# Patient Record
Sex: Male | Born: 1940 | Race: White | Hispanic: No | Smoking: Never smoker
Health system: Southern US, Community
[De-identification: ages and names within clinical notes are randomized; demographics above are authoritative.]

## PROBLEM LIST (undated history)

## (undated) DIAGNOSIS — M1990 Unspecified osteoarthritis, unspecified site: Secondary | ICD-10-CM

## (undated) DIAGNOSIS — F419 Anxiety disorder, unspecified: Secondary | ICD-10-CM

## (undated) DIAGNOSIS — M726 Necrotizing fasciitis: Secondary | ICD-10-CM

## (undated) DIAGNOSIS — I10 Essential (primary) hypertension: Secondary | ICD-10-CM

## (undated) DIAGNOSIS — F0280 Dementia in other diseases classified elsewhere without behavioral disturbance: Secondary | ICD-10-CM

## (undated) DIAGNOSIS — G309 Alzheimer's disease, unspecified: Secondary | ICD-10-CM

## (undated) DIAGNOSIS — S329XXA Fracture of unspecified parts of lumbosacral spine and pelvis, initial encounter for closed fracture: Secondary | ICD-10-CM

## (undated) HISTORY — DX: Essential (primary) hypertension: I10

## (undated) HISTORY — DX: Alzheimer's disease, unspecified: G30.9

## (undated) HISTORY — PX: AMPUTATION ARM: SHX6593

## (undated) HISTORY — DX: Dementia in other diseases classified elsewhere, unspecified severity, without behavioral disturbance, psychotic disturbance, mood disturbance, and anxiety: F02.80

## (undated) HISTORY — DX: Fracture of unspecified parts of lumbosacral spine and pelvis, initial encounter for closed fracture: S32.9XXA

## (undated) HISTORY — DX: Anxiety disorder, unspecified: F41.9

## (undated) HISTORY — DX: Unspecified osteoarthritis, unspecified site: M19.90

## (undated) HISTORY — DX: Necrotizing fasciitis: M72.6

---

## 2017-08-06 ENCOUNTER — Encounter: Payer: Self-pay | Admitting: *Deleted

## 2017-08-07 ENCOUNTER — Ambulatory Visit (INDEPENDENT_AMBULATORY_CARE_PROVIDER_SITE_OTHER): Payer: Medicare Other | Admitting: Diagnostic Neuroimaging

## 2017-08-07 ENCOUNTER — Encounter (INDEPENDENT_AMBULATORY_CARE_PROVIDER_SITE_OTHER): Payer: Self-pay

## 2017-08-07 VITALS — BP 154/96 | HR 70 | Ht 68.0 in | Wt 128.6 lb

## 2017-08-07 DIAGNOSIS — R413 Other amnesia: Secondary | ICD-10-CM

## 2017-08-07 DIAGNOSIS — F0390 Unspecified dementia without behavioral disturbance: Secondary | ICD-10-CM

## 2017-08-07 DIAGNOSIS — F03A0 Unspecified dementia, mild, without behavioral disturbance, psychotic disturbance, mood disturbance, and anxiety: Secondary | ICD-10-CM

## 2017-08-07 NOTE — Progress Notes (Signed)
[facility]  PATIENT: Warren Brooks DOB: 04-02-1941  REFERRING CLINICIAN: D Bouska HISTORY FROM: patient and son-in-law and chart review REASON FOR VISIT: new consult    HISTORICAL  CHIEF COMPLAINT:  Chief Complaint  Patient presents with  . New Patient (Initial Visit)    MMSE 16/30 - 14 animals. Patient is a right arm amputee and is also not from [place], so we had some difficulty with some of the questions.    HISTORY OF PRESENT ILLNESS:   76 year old male here for evaluation of memory loss and dementia.  Patient reports some mild memory loss issues over the past 3-5 years.  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  Patient indicates that this diagnosis was first mentioned on an MRI report from 2013.  He does not remember why the MRI scan was ordered at that time.  We do not have a copy of this report.  We do have some office notes from his family medicine clinic starting in 2014.  On those office visit notes patient has diagnosis of attention deficit disorder and Alzheimer's dementia listed.  No details regarding how those diagnoses were made is available.  Patient is a retired Pensions consultant.  He was living in [place] with his wife (second marriage).  Tragically this wife committed suicide in June 2018.  Apparently due to the circumstances and family discussions, patient and family decided that he would be better served to move to [place] to spend time with his daughter and son-in-law.  She was under the impression that this would be a temporary arrangement.  After moving to [facility] patient's daughter, son-in-law, and other family members realize that he would be better served to permanently moved to [facility], especially in light of his diagnosis of dementia, frequent falls, and history of alcohol abuse.  However there has been significant family stress and  tension.  Patient insists on moving back to [place].  He feels like he is capable of living alone even though his family members disagree.  Patient is accompanied by his son-in-law.  Patient's daughter was not able to come to this visit.   REVIEW OF SYSTEMS: Full 14 system review of systems performed and negative with exception of: Only as per HPI.  ALLERGIES: Allergies  Allergen Reactions  . Penicillins Hives    HOME MEDICATIONS: No outpatient prescriptions prior to visit.   No facility-administered medications prior to visit.     PAST MEDICAL HISTORY: Past Medical History:  Diagnosis Date  . Alzheimer disease   . Hypertension     PAST SURGICAL HISTORY: No past surgical history on file.  FAMILY HISTORY: No family history on file.  SOCIAL HISTORY:  Social History   Social History  . Marital status: Widowed    Spouse name: N/A  . Number of children: N/A  . Years of education: N/A   Occupational History  .      lawyer   Social History Main Topics  . Smoking status: Never Smoker  . Smokeless tobacco: Never Used  . Alcohol use Not on file  . Drug use: Unknown  . Sexual activity: Not on file   Other Topics Concern  . Not on file   Social History Narrative  . No narrative on file     PHYSICAL EXAM  GENERAL EXAM/CONSTITUTIONAL: Vitals:  Vitals:   08/07/17 1026  BP: (!) 154/96  Pulse: 70  Weight:  128 lb 9.6 oz (58.3 kg)  Height: 5\' 8"  (1.727 m)     Body mass index is 19.55 kg/m.  No exam data present  Patient is in no distress; well developed, nourished and groomed; neck is supple  CARDIOVASCULAR:  Examination of carotid arteries is normal; no carotid bruits  Regular rate and rhythm, no murmurs  Examination of peripheral vascular system by observation and palpation is normal  EYES:  Ophthalmoscopic exam of optic discs and posterior segments is normal; no papilledema or hemorrhages   MUSCULOSKELETAL: Gait, strength, tone, movements  noted in Neurologic exam below  NEUROLOGIC: MENTAL STATUS:  MMSE - Mini Mental State Exam 08/07/2017  Orientation to time 3  Orientation to Place 2  Registration 3  Attention/ Calculation 2  Recall 2  Language- name 2 objects 2  Language- repeat 0  Language- follow 3 step command 3  Language- read & follow direction 1  Write a sentence 0  Copy design 1  Total score 19    awake, alert, oriented to person  DECR MEMORY and remote memory intact  normal attention and concentration  language fluent, comprehension intact, naming intact,   fund of knowledge appropriate  ARGUMENTATIVE TOWARDS SON-IN-LAW; IMPATIENT; DECREASED INSIGHT  CRANIAL NERVE:   2nd - no papilledema on fundoscopic exam  2nd, 3rd, 4th, 6th - pupils equal and reactive to light, visual fields full to confrontation, extraocular muscles intact, no nystagmus  5th - facial sensation symmetric  7th - facial strength symmetric  8th - hearing intact  9th - palate elevates symmetrically, uvula midline  11th - shoulder shrug symmetric  12th - tongue protrusion midline  MOTOR:   normal bulk and tone, full strength in the BUE, BLE  RIGHT ARM AMPUTATION  SENSORY:   normal and symmetric to light touch, temperature  DECR VIBRATION AT TOES  COORDINATION:   finger-nose-finger, fine finger movements normal  REFLEXES:   deep tendon reflexes TRACE and symmetric  GAIT/STATION:   narrow based gait; SLOW AND UNSTEADY GAIT    DIAGNOSTIC DATA (LABS, IMAGING, TESTING) - I reviewed patient records, labs, notes, testing and imaging myself where available.  No results found for: WBC, HGB, HCT, MCV, PLT No results found for: NA, K, CL, CO2, GLUCOSE, BUN, CREATININE, CALCIUM, PROT, ALBUMIN, AST, ALT, ALKPHOS, BILITOT, GFRNONAA, GFRAA No results found for: CHOL, HDL, LDLCALC, LDLDIRECT, TRIG, CHOLHDL No results found for: [id number] No results found for: VITAMINB12 No results found for:  TSH      ASSESSMENT AND PLAN  76 y.o. year old male here with diagnosis of Alzheimer's dementia since ~2014, on medication, with history of alcohol abuse, ADD, depression and anxiety.  Patient now living with family in [place], previously living in [place] with wife.  Patient's wife tragically committed suicide and therefore moved to be closer and more supervised with family.  However there is significant ongoing family stress and tension as patient insists that he is able to live on his own and would like to move back to [place].  Patient's family and friends disagree with this.  Ddx: dementia (alzheimer's vs alcohol vs vascular) +/- ADD, +/- mood d/o  1. Memory loss   2. Mild dementia      PLAN:  I spent 75 minutes of face to face time with patient. Greater than 50% of time was spent in counseling and coordination of care with patient. In summary we discussed:   - consider MRI brain (eval for memory loss) - consider MRI cervical spine (  rule out myelopathy; history of falls) - will get additional information from family - patient is not likely a good candidate to live alone or drive at this time due to dementia, gait difficulty and poor insight  Return in about 3 months (around 11/07/2017).    Suanne Marker, MD 08/07/2017, 11:14 AM Certified in Neurology, Neurophysiology and Neuroimaging  [facility] [address] [place], [place] [address] [phone]

## 2017-08-08 ENCOUNTER — Telehealth: Payer: Self-pay | Admitting: Diagnostic Neuroimaging

## 2017-08-08 ENCOUNTER — Encounter: Payer: Self-pay | Admitting: Diagnostic Neuroimaging

## 2017-08-08 NOTE — Telephone Encounter (Signed)
Warren Brooks AgeSuzanne Brooks [phone] (friend that lives in [place]) called said pt's daughter [name] called her. She said Dr Demetrius CharityP wanted to get more information about the patient and why he should not live alone. She said they have known the pt for over 30 years. Please call her to discuss
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
## 2017-08-08 NOTE — Telephone Encounter (Signed)
Patient's daughter [name] would like to set up a time to speak with Dr. Marjory LiesPenumalli about her father. Dr. Marjory LiesPenumalli is aware of this. She is available anytime after 8a on Monday.

## 2017-08-08 NOTE — Telephone Encounter (Signed)
Pt's daughter Rogue BussingJillian Avedisian [phone] called reg her father's health. She will be available anytime to talk, she lives in [place]. She will not be available during the weekend and on Tuesday and Thursday

## 2017-08-08 NOTE — Telephone Encounter (Signed)
Fredrico, patient's son in law, called to try and set up a time for Dr. Marjory LiesPenumalli to speak with his wife and patient's daughter [name]. He says Monday, anytime would be great. She can be reached at [phone].

## 2017-08-08 NOTE — Telephone Encounter (Signed)
Patient brother Magda BernheimJoe Dubberly called and requested to speak with Dr. Marjory LiesPenumalli regarding his brothers health. Please call at [phone].

## 2017-08-12 NOTE — Telephone Encounter (Signed)
Pt's daughter [name] [phone] called said she was advised by her husband to call to speak with Dr Demetrius CharityP reg her father's health.

## 2017-08-12 NOTE — Telephone Encounter (Addendum)
Dr Peggye FormNeeland called, he was a neighbor in [place], pt's daughter asked him to call to talk with Dr Demetrius CharityP. He can be reached at [phone] today and tomorrow only. He will not be available the rest of the week
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
## 2017-08-12 NOTE — Telephone Encounter (Signed)
Warren ShaggyMary Miller [phone] called said RN was to call today to let her know what time frame Dr Demetrius CharityP will call.

## 2017-08-12 NOTE — Telephone Encounter (Signed)
Barnie MortCarolyn Sellmeyer [phone] called she lives in [place], she has known the pt for 14 years. She last saw him in July 2018. She was advised by [name] to call. She is a Runner, broadcasting/film/videoteacher.

## 2017-08-19 NOTE — Telephone Encounter (Signed)
Pt daughter states she is waiting to be called by Dr Marjory LiesPenumalli.  Pt daughter is now asking for a call back from RN Pincus SanesMary C [phone]

## 2017-08-19 NOTE — Telephone Encounter (Signed)
I called patient's daughter [name] x 2. No answer.  I called patient's daughter Valli GlanceJillian and spoke with her about the situation. Patient has poor insight, confabulates, states he is [place] living in [place], and likely still going through grieving process. She does not feel that he is able to drive or live alone.   Suanne MarkerVIKRAM R. Katrisha Segall, MD 08/19/2017, 3:28 PM Certified in Neurology, Neurophysiology and Neuroimaging  [facility] [address], [address] [place], [place] [id number] [phone]

## 2017-08-19 NOTE — Telephone Encounter (Signed)
Patient's daughter [name] is returning your call. I advised you had spoken to her sister but she wanted a call back to discuss her father with you. She can be reached at [phone]. I told her you will be in the office tomorrow.

## 2017-08-26 NOTE — Telephone Encounter (Signed)
I spoke with daughter [name]. Reviewed dx, prognosis, tx options. For now, patient has stabilized and is more cooperative. Continue current plan. -VRP

## 2017-09-24 ENCOUNTER — Ambulatory Visit: Payer: Medicare Other | Admitting: Neurology

## 2017-11-01 ENCOUNTER — Encounter: Payer: Self-pay | Admitting: Internal Medicine

## 2017-11-05 ENCOUNTER — Ambulatory Visit: Payer: Self-pay | Admitting: Internal Medicine

## 2017-11-11 ENCOUNTER — Encounter: Payer: Self-pay | Admitting: Internal Medicine

## 2017-11-11 ENCOUNTER — Ambulatory Visit (INDEPENDENT_AMBULATORY_CARE_PROVIDER_SITE_OTHER): Payer: Medicare Other | Admitting: Internal Medicine

## 2017-11-11 VITALS — BP 112/70 | HR 66 | Ht 64.0 in | Wt 127.4 lb

## 2017-11-11 DIAGNOSIS — K8020 Calculus of gallbladder without cholecystitis without obstruction: Secondary | ICD-10-CM | POA: Diagnosis not present

## 2017-11-11 DIAGNOSIS — K838 Other specified diseases of biliary tract: Secondary | ICD-10-CM | POA: Diagnosis not present

## 2017-11-11 DIAGNOSIS — R194 Change in bowel habit: Secondary | ICD-10-CM | POA: Diagnosis not present

## 2017-11-11 DIAGNOSIS — K831 Obstruction of bile duct: Secondary | ICD-10-CM

## 2017-11-11 NOTE — Patient Instructions (Signed)
You have been scheduled for an MRI/MRCP at [facility] on 11/12/17. Your appointment time is 10:00AM. Please arrive 15 minutes prior to your appointment time for registration purposes. Please make certain not to have anything to eat or drink 4 hours prior to your test. In addition, if you have any metal in your body, have a pacemaker or defibrillator, please be sure to let your ordering physician know. This test typically takes 45 minutes to 1 hour to complete. Should you need to reschedule, please call [phone] to do so.   I appreciate the opportunity to care for you. Stan Headarl Gessner, MD, [facility]

## 2017-11-11 NOTE — Progress Notes (Addendum)
Warren Brooks 76 y.o. [birth year] [id number]  Assessment & Plan:   Encounter Diagnoses  Name Primary?  . Obstructive jaundice Yes  . Gallstones   . Change in bowel habits    1. Obstructive jaundice/Gallstones- based on pt's [place], labs, and presentation it is likely that he has obstructive jaundice secondary to a stone in the bile duct. Recommend an MRI of abdomen to gain further insight into pt's condition, ERCP recommended if MRI confirms suspicion. Pt and daughter counseled on risks and benefits of these procedures and they are in agreement with plan. Labs were redrawn today by Warren Brooks- results are pending.  2. Change in bowel habits- Colonoscopy warranted, however will table this for after his on-going biliary  issues with  have resolved. Pt agreeable.  I have personally seen the patient, reviewed and repeated key elements of the history and physical and participated in formation of the assessment and plan the student has documented.  Not sure if Signs/sxs related - hope not. Think non-invasive MRCP first makes sense then decide next step(s). Suspect CBD stone (? Passed) but have some concern about GI malignancy (pancreatic possible) and also colon cancer possible w/ change in bowel habits  Warren Boop, MD, [facility]  WU:[id number], Warren Hua, MD   Subjective:   Chief Complaint: Change in stool and abdominal pain  HPI Pt is a 77 yo caucasian male presenting with his daughter for a new pt appt. He is here today with 2 complaints- change in stool and abdominal pain.  Change in stool- started approximately 1 month ago, stools have been thin and appear like a "water eel". He has been having some constipation, no diarrhea. No melena or hematochezia. Stools appear a bit paler x 2 weeks. Pt cannot recall year of last colonoscopy, hes from [facility], recently moved to [facility]. No hx of colonic polyps or fhx of CA. No significant weight loss, changes in appetite, or fecal incontinence. Requests a  colonoscopy. Thinks he had one in [place] several yrs or more ago (negative)  Abdominal pain- start approx 2 weeks ago, pain was diffuse, waxed and waned, with one episode of vomiting and clamminess for 24 hours a week ago. He was seen by his PCP Warren Brooks, US showed notable dilation of the bile duct, thickening of GB wall, sludge and stones in the GB. Labs showed elevated levels of bilirubin, AST, ALT, ALP, WBC. Pt has been improving for the last 48 hours, daughter reports he appeared slightly jaundice last week without any fever or Brooks sxs of an acute infection. He has also been having orange colored urine for the last week, denies dysuria, nocturia, leakage, or suprapubic/flank pain.   Daughter participates in hx - she reports he has stopped EtOH lately (habit 2/day)   Allergies  Allergen Reactions  . Penicillins Hives   Current Meds  Medication Sig  . ALPRAZolam (XANAX) 0.25 MG tablet Take 0.25 mg by mouth at bedtime as needed.  . citalopram (CELEXA) 20 MG tablet Take 20 mg by mouth.  . docusate sodium (COLACE) 100 MG capsule Take 100 mg by mouth.  . donepezil (ARICEPT) 10 MG tablet Take 10 mg by mouth at bedtime.  . gabapentin (NEURONTIN) 400 MG capsule Take 400 mg by mouth.  Marland Kitchen lisinopril (PRINIVIL,ZESTRIL) 10 MG tablet Take 10 mg by mouth.  . memantine (NAMENDA) 10 MG tablet Take 10 mg by mouth.   Past Medical History:  Diagnosis Date  . Alzheimer disease   . Anxiety   .  Hypertension   . Necrotizing fasciitis (HCC)   . Osteoarthritis   . Pelvic fracture [facility](HCC)    Past Surgical History:  Procedure Laterality Date  . AMPUTATION ARM Right    Necrotic fascitis   Social History   Social History Narrative   Retired Pensions consultantattorney, widowed   2 daughters   2 EtOH/day    3-4 caffeine/day   FHx not contributory   Review of Systems Positive for abdominal pain, change in bowel habits, constipation, N/V, change in urine color. Negative for fever, night sweats, chills, weight loss,  dysuria, diarrhea, flank pain, hematochezia, or melena.  Negative for all Brooks systems.  Objective:   Physical Exam  @BP  112/70   Pulse 66   Ht 5\' 4"  (1.626 m)   Wt 127 lb 6 oz (57.8 kg)   BMI 21.86 kg/m @  General:  Well-developed, well-nourished and in no acute distress Eyes:  anicteric. ENT:   Mouth and posterior pharynx free of lesions. Soft palate slightly jaundiced. Neck:   supple w/o thyromegaly or mass.  Lungs: Clear to auscultation bilaterally. Heart:  S1S2, no rubs, murmurs, gallops. Abdomen:  soft, non-tender, no hepatosplenomegaly, hernia, or mass and BS+. Wears back brace Rectal: Deferred until colonoscopy Lymph:  no cervical or supraclavicular adenopathy. Extremities:   no edema, cyanosis or clubbing. Amputated R arm. Skin   no rash. Mild jaundice of skin Neuro:  Alert - answers ? Appropriately - orientation not formally tested  Psych:  Fairly appropriate mood and affect, slightly tangential   Data Reviewed: PCP notes, labs, imaging, meds, PMH.

## 2017-11-12 ENCOUNTER — Other Ambulatory Visit: Payer: Self-pay

## 2017-11-12 ENCOUNTER — Ambulatory Visit (HOSPITAL_COMMUNITY)
Admission: RE | Admit: 2017-11-12 | Discharge: 2017-11-12 | Disposition: A | Discharge status: Home / Self Care | Payer: Medicare Other | Source: Ambulatory Visit | Attending: Internal Medicine | Admitting: Internal Medicine

## 2017-11-12 ENCOUNTER — Other Ambulatory Visit: Payer: Self-pay | Admitting: Internal Medicine

## 2017-11-12 DIAGNOSIS — K8020 Calculus of gallbladder without cholecystitis without obstruction: Secondary | ICD-10-CM | POA: Diagnosis present

## 2017-11-12 DIAGNOSIS — K831 Obstruction of bile duct: Secondary | ICD-10-CM

## 2017-11-12 DIAGNOSIS — K449 Diaphragmatic hernia without obstruction or gangrene: Secondary | ICD-10-CM | POA: Insufficient documentation

## 2017-11-12 DIAGNOSIS — R195 Other fecal abnormalities: Secondary | ICD-10-CM | POA: Diagnosis not present

## 2017-11-12 DIAGNOSIS — K8070 Calculus of gallbladder and bile duct without cholecystitis without obstruction: Secondary | ICD-10-CM | POA: Diagnosis not present

## 2017-11-12 DIAGNOSIS — K838 Other specified diseases of biliary tract: Secondary | ICD-10-CM | POA: Diagnosis present

## 2017-11-12 MED ORDER — GADOBENATE DIMEGLUMINE 529 MG/ML IV SOLN
15.0000 mL | Freq: Once | INTRAVENOUS | Status: AC | PRN
  Administered 2017-11-12: 11 mL via INTRAVENOUS

## 2017-11-12 MED ORDER — CIPROFLOXACIN HCL 500 MG PO TABS: 500.0000 mg | ORAL_TABLET | Freq: Two times a day (BID) | ORAL | 0 refills | Status: DC

## 2017-11-12 MED ORDER — METRONIDAZOLE 500 MG PO TABS: 500.0000 mg | ORAL_TABLET | Freq: Three times a day (TID) | ORAL | 0 refills | Status: DC

## 2017-11-12 NOTE — Progress Notes (Signed)
The MRCP shows signs of cholecystitis The bile duct is not dilated anymore as was on ultrasound and LFT's ([facility] Everywhere) are better - I think he passed a common bile duct stone  He was ok clinically in the office yesterday - he does need to see a surgeon re: cholelithiasis ? Cholecystitis I would start cipro 500 mg bid and metronidazole 500 mg tid x 7 days each and should go to ED if gets sxs like we discussed - severe pain, high fever, vomiting, etc  Refer to surgery - cholelithiasis and possible cholecystitis  Would talk to daughter given patient's dementia  He eventually needs a colonoscopy for change in bowel habits  Let's see what surgery does first - would make a f/u with me next available to make sure we do not lose track

## 2017-11-13 LAB — POCT I-STAT CREATININE: CREATININE: 0.7 mg/dL (ref 0.61–1.24)

## 2017-11-20 ENCOUNTER — Encounter (HOSPITAL_COMMUNITY): Payer: Self-pay | Admitting: *Deleted

## 2017-11-20 ENCOUNTER — Other Ambulatory Visit: Payer: Self-pay

## 2017-11-20 ENCOUNTER — Ambulatory Visit: Payer: Self-pay | Admitting: General Surgery

## 2017-11-20 NOTE — Progress Notes (Signed)
Please place orders in Epic as patient is being scheduled for a pre-op appointment! Thank you! 

## 2017-11-21 NOTE — Progress Notes (Addendum)
[facility] - Preparing for Surgery Before surgery, you can play an important role.  Because skin is not sterile, your skin needs to be as free of germs as possible.  You can reduce the number of germs on your skin by washing with CHG (chlorahexidine gluconate) soap before surgery.  CHG is an antiseptic cleaner which kills germs and bonds with the skin to continue killing germs even after washing. Please DO NOT use if you have an allergy to CHG or antibacterial soaps.  If your skin becomes reddened/irritated stop using the CHG and inform your nurse when you arrive at Short Stay. Do not shave (including legs and underarms) for at least 48 hours prior to the first CHG shower.  You may shave your face/neck. Please follow these instructions carefully:  1.  Shower with CHG Soap the night before surgery and the  morning of Surgery.  2.  If you choose to wash your hair, wash your hair first as usual with your  normal  shampoo.  3.  After you shampoo, rinse your hair and body thoroughly to remove the  shampoo.                           4.  Use CHG as you would any other liquid soap.  You can apply chg directly  to the skin and wash                       Gently with a scrungie or clean washcloth.  5.  Apply the CHG Soap to your body ONLY FROM THE NECK DOWN.   Do not use on face/ open                           Wound or open sores. Avoid contact with eyes, ears mouth and genitals (private parts).                       Wash face,  Genitals (private parts) with your normal soap.             6.  Wash thoroughly, paying special attention to the area where your surgery  will be performed.  7.  Thoroughly rinse your body with warm water from the neck down.  8.  DO NOT shower/wash with your normal soap after using and rinsing off  the CHG Soap.                9.  Pat yourself dry with a clean towel.            10.  Wear clean pajamas.            11.  Place clean sheets on your bed the night of your first shower and  do not  sleep with pets. Day of Surgery : Do not apply any lotions/deodorants the morning of surgery.  Please wear clean clothes to the hospital/surgery center.  FAILURE TO FOLLOW THESE INSTRUCTIONS MAY RESULT IN THE CANCELLATION OF YOUR SURGERY PATIENT SIGNATURE_________________________________  NURSE SIGNATURE__________________________________  ________________________________________________________________________ NO SOLID FOOD AFTER MIDNIGHT THE NIGHT PRIOR TO SRRGERY. NOTHING BY MOUTH EXCEPT CLEAR LIQUIDS UNTIL 3 HOURS PRIOR TO SCHEULED SURGERY. PLEASE FINISH ENSURE DRINK PER SURGEON ORDER 3 HOURS PRIOR TO SCHEDULED SURGERY TIME WHICH NEEDS TO BE COMPLETED AT  1030 am.  Daughter picked up carbohydrate drink and CHG wash and verbalized understanding  of instructions.

## 2017-11-22 ENCOUNTER — Other Ambulatory Visit: Payer: Self-pay

## 2017-11-22 ENCOUNTER — Ambulatory Visit (HOSPITAL_COMMUNITY): Payer: Medicare Other | Admitting: Certified Registered Nurse Anesthetist

## 2017-11-22 ENCOUNTER — Encounter (HOSPITAL_COMMUNITY): Payer: Self-pay | Admitting: Certified Registered Nurse Anesthetist

## 2017-11-22 ENCOUNTER — Ambulatory Visit (HOSPITAL_COMMUNITY)
Admission: RE | Admit: 2017-11-22 | Discharge: 2017-11-22 | Disposition: A | Discharge status: Home / Self Care | Payer: Medicare Other | Source: Ambulatory Visit | Attending: General Surgery | Admitting: General Surgery

## 2017-11-22 ENCOUNTER — Ambulatory Visit (HOSPITAL_COMMUNITY): Payer: Medicare Other

## 2017-11-22 ENCOUNTER — Encounter (HOSPITAL_COMMUNITY)
Admission: RE | Disposition: A | Discharge status: Home / Self Care | Payer: Self-pay | Source: Ambulatory Visit | Attending: General Surgery

## 2017-11-22 DIAGNOSIS — I10 Essential (primary) hypertension: Secondary | ICD-10-CM | POA: Diagnosis not present

## 2017-11-22 DIAGNOSIS — F329 Major depressive disorder, single episode, unspecified: Secondary | ICD-10-CM | POA: Insufficient documentation

## 2017-11-22 DIAGNOSIS — K8020 Calculus of gallbladder without cholecystitis without obstruction: Secondary | ICD-10-CM | POA: Diagnosis present

## 2017-11-22 DIAGNOSIS — Z419 Encounter for procedure for purposes other than remedying health state, unspecified: Secondary | ICD-10-CM

## 2017-11-22 DIAGNOSIS — Z79899 Other long term (current) drug therapy: Secondary | ICD-10-CM | POA: Diagnosis not present

## 2017-11-22 DIAGNOSIS — K8010 Calculus of gallbladder with chronic cholecystitis without obstruction: Secondary | ICD-10-CM | POA: Diagnosis not present

## 2017-11-22 DIAGNOSIS — F0390 Unspecified dementia without behavioral disturbance: Secondary | ICD-10-CM | POA: Insufficient documentation

## 2017-11-22 DIAGNOSIS — F419 Anxiety disorder, unspecified: Secondary | ICD-10-CM | POA: Diagnosis not present

## 2017-11-22 HISTORY — PX: CHOLECYSTECTOMY: SHX55

## 2017-11-22 LAB — CBC
HCT: 33.4 % — ABNORMAL LOW (ref 39.0–52.0)
Hemoglobin: 11.3 g/dL — ABNORMAL LOW (ref 13.0–17.0)
MCH: 31.4 pg (ref 26.0–34.0)
MCHC: 33.8 g/dL (ref 30.0–36.0)
MCV: 92.8 fL (ref 78.0–100.0)
Platelets: 292 10*3/uL (ref 150–400)
RBC: 3.6 MIL/uL — ABNORMAL LOW (ref 4.22–5.81)
RDW: 12.8 % (ref 11.5–15.5)
WBC: 3.7 10*3/uL — AB (ref 4.0–10.5)

## 2017-11-22 LAB — COMPREHENSIVE METABOLIC PANEL
ALK PHOS: 127 U/L — AB (ref 38–126)
ALT: 41 U/L (ref 17–63)
AST: 35 U/L (ref 15–41)
Albumin: 3.9 g/dL (ref 3.5–5.0)
Anion gap: 6 (ref 5–15)
BILIRUBIN TOTAL: 0.5 mg/dL (ref 0.3–1.2)
BUN: 17 mg/dL (ref 6–20)
CALCIUM: 9 mg/dL (ref 8.9–10.3)
CO2: 25 mmol/L (ref 22–32)
CREATININE: 0.7 mg/dL (ref 0.61–1.24)
Chloride: 103 mmol/L (ref 101–111)
Glucose, Bld: 104 mg/dL — ABNORMAL HIGH (ref 65–99)
Potassium: 3.6 mmol/L (ref 3.5–5.1)
Sodium: 134 mmol/L — ABNORMAL LOW (ref 135–145)
Total Protein: 6.5 g/dL (ref 6.5–8.1)

## 2017-11-22 LAB — LIPASE, BLOOD: LIPASE: 45 U/L (ref 11–51)

## 2017-11-22 SURGERY — LAPAROSCOPIC CHOLECYSTECTOMY WITH INTRAOPERATIVE CHOLANGIOGRAM
Anesthesia: General | Site: Abdomen

## 2017-11-22 MED ORDER — GLYCOPYRROLATE 0.2 MG/ML IJ SOLN
INTRAMUSCULAR | Status: DC | PRN
  Administered 2017-11-22: 0.2 mg via INTRAVENOUS

## 2017-11-22 MED ORDER — LIDOCAINE 2% (20 MG/ML) 5 ML SYRINGE
INTRAMUSCULAR | Status: DC | PRN
  Administered 2017-11-22: 80 mg via INTRAVENOUS

## 2017-11-22 MED ORDER — CELECOXIB 200 MG PO CAPS
200.0000 mg | ORAL_CAPSULE | ORAL | Status: AC
  Administered 2017-11-22: 200 mg via ORAL
  Filled 2017-11-22: qty 1

## 2017-11-22 MED ORDER — FENTANYL CITRATE (PF) 100 MCG/2ML IJ SOLN
INTRAMUSCULAR | Status: DC | PRN
  Administered 2017-11-22 (×3): 50 ug via INTRAVENOUS

## 2017-11-22 MED ORDER — DEXAMETHASONE SODIUM PHOSPHATE 10 MG/ML IJ SOLN
INTRAMUSCULAR | Status: AC
  Filled 2017-11-22: qty 1

## 2017-11-22 MED ORDER — ONDANSETRON HCL 4 MG/2ML IJ SOLN: 4.0000 mg | Freq: Once | INTRAMUSCULAR | Status: DC | PRN

## 2017-11-22 MED ORDER — IOPAMIDOL (ISOVUE-300) INJECTION 61%
INTRAVENOUS | Status: DC | PRN
  Administered 2017-11-22: 7.5 mL

## 2017-11-22 MED ORDER — EPHEDRINE SULFATE-NACL 50-0.9 MG/10ML-% IV SOSY
PREFILLED_SYRINGE | INTRAVENOUS | Status: DC | PRN
  Administered 2017-11-22: 10 mg via INTRAVENOUS
  Administered 2017-11-22: 5 mg via INTRAVENOUS

## 2017-11-22 MED ORDER — FENTANYL CITRATE (PF) 100 MCG/2ML IJ SOLN
INTRAMUSCULAR | Status: AC
  Filled 2017-11-22: qty 2

## 2017-11-22 MED ORDER — PROPOFOL 10 MG/ML IV BOLUS
INTRAVENOUS | Status: DC | PRN
  Administered 2017-11-22: 100 mg via INTRAVENOUS

## 2017-11-22 MED ORDER — MEPERIDINE HCL 50 MG/ML IJ SOLN: 6.2500 mg | INTRAMUSCULAR | Status: DC | PRN

## 2017-11-22 MED ORDER — ONDANSETRON HCL 4 MG/2ML IJ SOLN
INTRAMUSCULAR | Status: DC | PRN
  Administered 2017-11-22: 4 mg via INTRAVENOUS

## 2017-11-22 MED ORDER — LACTATED RINGERS IV SOLN
INTRAVENOUS | Status: DC
  Administered 2017-11-22: 12:00:00 via INTRAVENOUS

## 2017-11-22 MED ORDER — SUGAMMADEX SODIUM 200 MG/2ML IV SOLN
INTRAVENOUS | Status: AC
  Filled 2017-11-22: qty 2

## 2017-11-22 MED ORDER — ROCURONIUM BROMIDE 10 MG/ML (PF) SYRINGE
PREFILLED_SYRINGE | INTRAVENOUS | Status: AC
  Filled 2017-11-22: qty 5

## 2017-11-22 MED ORDER — PROPOFOL 10 MG/ML IV BOLUS
INTRAVENOUS | Status: AC
  Filled 2017-11-22: qty 20

## 2017-11-22 MED ORDER — PHENYLEPHRINE 40 MCG/ML (10ML) SYRINGE FOR IV PUSH (FOR BLOOD PRESSURE SUPPORT)
PREFILLED_SYRINGE | INTRAVENOUS | Status: AC
  Filled 2017-11-22: qty 10

## 2017-11-22 MED ORDER — ROCURONIUM BROMIDE 10 MG/ML (PF) SYRINGE
PREFILLED_SYRINGE | INTRAVENOUS | Status: DC | PRN
  Administered 2017-11-22: 20 mg via INTRAVENOUS
  Administered 2017-11-22: 50 mg via INTRAVENOUS

## 2017-11-22 MED ORDER — 0.9 % SODIUM CHLORIDE (POUR BTL) OPTIME
TOPICAL | Status: DC | PRN
  Administered 2017-11-22: 1000 mL

## 2017-11-22 MED ORDER — CHLORHEXIDINE GLUCONATE CLOTH 2 % EX PADS: 6.0000 | MEDICATED_PAD | Freq: Once | CUTANEOUS | Status: DC

## 2017-11-22 MED ORDER — DEXAMETHASONE SODIUM PHOSPHATE 4 MG/ML IJ SOLN
INTRAMUSCULAR | Status: DC | PRN
  Administered 2017-11-22: 5 mg via INTRAVENOUS

## 2017-11-22 MED ORDER — EPHEDRINE 5 MG/ML INJ
INTRAVENOUS | Status: AC
  Filled 2017-11-22: qty 10

## 2017-11-22 MED ORDER — TRAMADOL HCL 50 MG PO TABS: 50.0000 mg | ORAL_TABLET | Freq: Four times a day (QID) | ORAL | 1 refills | Status: AC | PRN

## 2017-11-22 MED ORDER — LACTATED RINGERS IR SOLN
Status: DC | PRN
  Administered 2017-11-22: 1000 mL

## 2017-11-22 MED ORDER — BUPIVACAINE-EPINEPHRINE 0.25% -1:200000 IJ SOLN
INTRAMUSCULAR | Status: DC | PRN
  Administered 2017-11-22: 14 mL

## 2017-11-22 MED ORDER — GABAPENTIN 300 MG PO CAPS
300.0000 mg | ORAL_CAPSULE | ORAL | Status: AC
  Administered 2017-11-22: 300 mg via ORAL
  Filled 2017-11-22: qty 1

## 2017-11-22 MED ORDER — PHENYLEPHRINE 40 MCG/ML (10ML) SYRINGE FOR IV PUSH (FOR BLOOD PRESSURE SUPPORT)
PREFILLED_SYRINGE | INTRAVENOUS | Status: DC | PRN
  Administered 2017-11-22 (×2): 80 ug via INTRAVENOUS

## 2017-11-22 MED ORDER — ACETAMINOPHEN 500 MG PO TABS
1000.0000 mg | ORAL_TABLET | ORAL | Status: AC
  Administered 2017-11-22: 1000 mg via ORAL
  Filled 2017-11-22: qty 2

## 2017-11-22 MED ORDER — LIDOCAINE 2% (20 MG/ML) 5 ML SYRINGE
INTRAMUSCULAR | Status: AC
  Filled 2017-11-22: qty 5

## 2017-11-22 MED ORDER — ONDANSETRON HCL 4 MG/2ML IJ SOLN
INTRAMUSCULAR | Status: AC
  Filled 2017-11-22: qty 2

## 2017-11-22 MED ORDER — CIPROFLOXACIN IN D5W 400 MG/200ML IV SOLN
400.0000 mg | INTRAVENOUS | Status: AC
  Administered 2017-11-22: 400 mg via INTRAVENOUS
  Filled 2017-11-22: qty 200

## 2017-11-22 MED ORDER — GLYCOPYRROLATE 0.2 MG/ML IV SOSY
PREFILLED_SYRINGE | INTRAVENOUS | Status: AC
  Filled 2017-11-22: qty 5

## 2017-11-22 MED ORDER — FENTANYL CITRATE (PF) 100 MCG/2ML IJ SOLN: 25.0000 ug | INTRAMUSCULAR | Status: DC | PRN

## 2017-11-22 SURGICAL SUPPLY — 39 items
APPLIER CLIP ROT 10 11.4 M/L (STAPLE) ×3
CABLE HIGH FREQUENCY MONO STRZ (ELECTRODE) ×3 IMPLANT
CATH REDDICK CHOLANGI 4FR 50CM (CATHETERS) IMPLANT
CHLORAPREP W/TINT 26ML (MISCELLANEOUS) ×3 IMPLANT
CLIP APPLIE ROT 10 11.4 M/L (STAPLE) ×1 IMPLANT
COVER MAYO STAND STRL (DRAPES) ×3 IMPLANT
COVER SURGICAL LIGHT HANDLE (MISCELLANEOUS) ×3 IMPLANT
DECANTER SPIKE VIAL GLASS SM (MISCELLANEOUS) ×3 IMPLANT
DERMABOND ADVANCED (GAUZE/BANDAGES/DRESSINGS) ×2
DERMABOND ADVANCED .7 DNX12 (GAUZE/BANDAGES/DRESSINGS) ×1 IMPLANT
DRAPE C-ARM 42X120 X-RAY (DRAPES) ×3 IMPLANT
ELECT REM PT RETURN 15FT ADLT (MISCELLANEOUS) ×3 IMPLANT
GLOVE BIO SURGEON STRL SZ 6.5 (GLOVE) ×4 IMPLANT
GLOVE BIO SURGEONS STRL SZ 6.5 (GLOVE) ×2
GLOVE BIOGEL PI IND STRL 6.5 (GLOVE) ×2 IMPLANT
GLOVE BIOGEL PI IND STRL 7.5 (GLOVE) ×2 IMPLANT
GLOVE BIOGEL PI INDICATOR 6.5 (GLOVE) ×4
GLOVE BIOGEL PI INDICATOR 7.5 (GLOVE) ×4
GLOVE ECLIPSE 7.5 STRL STRAW (GLOVE) ×3 IMPLANT
GLOVE SURG SS PI 6.5 STRL IVOR (GLOVE) ×3 IMPLANT
GOWN STRL REUS W/TWL LRG LVL3 (GOWN DISPOSABLE) ×9 IMPLANT
GOWN STRL REUS W/TWL XL LVL3 (GOWN DISPOSABLE) ×9 IMPLANT
HEMOSTAT SNOW SURGICEL 2X4 (HEMOSTASIS) IMPLANT
HEMOSTAT SURGICEL 4X8 (HEMOSTASIS) IMPLANT
KIT BASIN OR (CUSTOM PROCEDURE TRAY) ×3 IMPLANT
POUCH RETRIEVAL ECOSAC 10 (ENDOMECHANICALS) ×1 IMPLANT
POUCH RETRIEVAL ECOSAC 10MM (ENDOMECHANICALS) ×2
POUCH SPECIMEN RETRIEVAL 10MM (ENDOMECHANICALS) IMPLANT
SCISSORS METZENBAUM CVD 33 (INSTRUMENTS) ×3 IMPLANT
SET CHOLANGIOGRAPH MIX (MISCELLANEOUS) ×3 IMPLANT
SET IRRIG TUBING LAPAROSCOPIC (IRRIGATION / IRRIGATOR) ×3 IMPLANT
SLEEVE XCEL OPT CAN 5 100 (ENDOMECHANICALS) ×3 IMPLANT
SUT MNCRL AB 4-0 PS2 18 (SUTURE) ×3 IMPLANT
TOWEL OR 17X26 10 PK STRL BLUE (TOWEL DISPOSABLE) ×3 IMPLANT
TRAY LAPAROSCOPIC (CUSTOM PROCEDURE TRAY) ×3 IMPLANT
TROCAR BLADELESS OPT 5 100 (ENDOMECHANICALS) ×3 IMPLANT
TROCAR XCEL BLUNT TIP 100MML (ENDOMECHANICALS) ×3 IMPLANT
TROCAR XCEL NON-BLD 11X100MML (ENDOMECHANICALS) ×3 IMPLANT
TUBING INSUF HEATED (TUBING) ×3 IMPLANT

## 2017-11-22 NOTE — Interval H&P Note (Signed)
History and Physical Interval Note:  11/22/2017 1:14 PM  Warren MuskratRobert Brooks  has presented today for surgery, with the diagnosis of cholelithiasis  The various methods of treatment have been discussed with the patient and family. After consideration of risks, benefits and other options for treatment, the patient has consented to  Procedure(s): LAPAROSCOPIC CHOLECYSTECTOMY WITH INTRAOPERATIVE CHOLANGIOGRAM (N/A) as a surgical intervention .  The patient's history has been reviewed, patient examined, no change in status, stable for surgery.  I have reviewed the patient's chart and labs.  Questions were answered to the patient's satisfaction.     Lorne SkeensBenjamin T Rush Salce
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
## 2017-11-22 NOTE — Transfer of Care (Signed)
Immediate Anesthesia Transfer of Care Note  Patient: Warren MuskratRobert Zeller  Procedure(s) Performed: LAPAROSCOPIC CHOLECYSTECTOMY WITH INTRAOPERATIVE CHOLANGIOGRAM (N/A Abdomen)  Patient Location: PACU  Anesthesia Type:General  Level of Consciousness: sedated  Airway & Oxygen Therapy: Patient Spontanous Breathing and Patient connected to face mask  Post-op Assessment: stable  Post vital signs: Reviewed and stable  Last Vitals:  Vitals:   11/22/17 1122  BP: 118/84  Pulse: 68  Resp: 18  Temp: 36.7 C  SpO2: 100%    Last Pain:  Vitals:   11/22/17 1122  TempSrc: Oral         Complications: No apparent anesthesia complications

## 2017-11-22 NOTE — Progress Notes (Signed)
Received report from Michelle Stubblefield, RN 

## 2017-11-22 NOTE — Anesthesia Preprocedure Evaluation (Signed)
Anesthesia Evaluation  Patient identified by MRN, date of birth, ID band Patient awake    Reviewed: Allergy & Precautions, H&P , Patient's Chart, lab work & pertinent test results, reviewed documented beta blocker date and time   Airway Mallampati: II  TM Distance: >3 FB Neck ROM: full    Dental no notable dental hx.    Pulmonary    Pulmonary exam normal breath sounds clear to auscultation       Cardiovascular hypertension,  Rhythm:regular Rate:Normal     Neuro/Psych    GI/Hepatic   Endo/Other    Renal/GU      Musculoskeletal   Abdominal   Peds  Hematology   Anesthesia Other Findings   Reproductive/Obstetrics                             Anesthesia Physical Anesthesia Plan  ASA: II  Anesthesia Plan: General   Post-op Pain Management:    Induction: Intravenous  PONV Risk Score and Plan: 2 and Treatment may vary due to age or medical condition, Dexamethasone and Ondansetron  Airway Management Planned: Oral ETT  Additional Equipment:   Intra-op Plan:   Post-operative Plan: Extubation in OR  Informed Consent: I have reviewed the patients History and Physical, chart, labs and discussed the procedure including the risks, benefits and alternatives for the proposed anesthesia with the patient or authorized representative who has indicated his/her understanding and acceptance.   Dental Advisory Given  Plan Discussed with: CRNA and Surgeon  Anesthesia Plan Comments: (  )        Anesthesia Quick Evaluation  

## 2017-11-22 NOTE — Op Note (Signed)
Preoperative Diagnosis: cholelithiasis with recent obstructive jaundice  Postoprative Diagnosis: Same  Procedure: Procedure(s): LAPAROSCOPIC CHOLECYSTECTOMY WITH INTRAOPERATIVE CHOLANGIOGRAM   Surgeon: Glenna FellowsHoxworth, Deijah Spikes T   Assistants: None  Anesthesia:  General endotracheal anesthesia  Indications: Patient is a 77 year old male who recently presented after a several day episode of epigastric pain and recent onset of jaundice.  He had moderately elevated LFTs and a gallbladder ultrasound showing small stones and borderline enlarged common bile duct.  His symptoms resolved and LFTs have returned to normal.  We have recommended proceeding with laparoscopic cholecystectomy with cholangiogram to prevent further episodes and other complications.  The procedure and risks have been discussed in detail with the patient and his daughter documented elsewhere.    Procedure Detail: Patient was brought to the operating room, placed in the supine position on the operating table, and general endotracheal anesthesia induced.  He received preoperative IV antibiotics.  The abdomen was widely sterilely prepped and draped.  Patient timeout was performed and correct procedure verified.  Access was obtained with a 1/2 cm incision at the umbilicus and the Hassan trocar was placed through a mattress suture of 0 Vicryl.  Under direct vision an 11 mm trocar was placed subxiphoid and 2 5 mm trochars along the right subcostal margin.  The gallbladder was noted to be somewhat thickened chronically appearing and decompressed.  The fundus was grasped and elevated up over the liver and the infundibulum retracted inferolaterally.  Peritoneum anterior and posterior to the distal gallbladder was incised and the distal gallbladder thoroughly dissected.  The cystic artery and cystic duct were skeletonized and the gallbladder dissected off the cholecystic plate obtaining a good critical view.  When the anatomy was clear the cystic  artery was clipped and the cystic duct clipped of the gallbladder junction and an operative cholangiogram obtained through the cystic duct.  This showed good filling of a normal common bile duct and intrahepatic ducts with free flow into the duodenum and no filling defects.  The Clance cath was removed and the cystic duct was triply clipped proximally and divided.  The cystic artery was further clipped proximally and distally and divided.  The gallbladder was dissected free from its bed using hook cautery, placed in an eco-catch bag and brought out through the umbilical incision.  The right upper quadrant was irrigated and complete hemostasis assured.  No evidence of bleeding or injury or other problems.  All CO2 was evacuated and trochars removed and the mattress suture secured at the umbilicus.  Skin incisions were closed with subarticular Monocryl and Dermabond.  Sponge needle and instrument counts were correct.    Findings: Chronic cholecystitis, normal operative cholangiogram  Estimated Blood Loss:  Minimal         Drains: None  Blood Given: none          Specimens: Gallbladder and contents        Complications:  * No complications entered in OR log *         Disposition: PACU - hemodynamically stable.         Condition: stable

## 2017-11-22 NOTE — Anesthesia Procedure Notes (Signed)
Procedure Name: Intubation Date/Time: 11/22/2017 1:30 PM Performed by: Vanessa Durhamochran, Javonn Gauger Glenn, CRNA Pre-anesthesia Checklist: Patient identified, Emergency Drugs available, Suction available and Patient being monitored Patient Re-evaluated:Patient Re-evaluated prior to induction Oxygen Delivery Method: Circle system utilized Preoxygenation: Pre-oxygenation with 100% oxygen Induction Type: IV induction Ventilation: Mask ventilation without difficulty Laryngoscope Size: 2 and Miller Grade View: Grade I Tube type: Oral Tube size: 7.5 mm Number of attempts: 1 Airway Equipment and Method: Stylet (Tooth guard (upper) used) Placement Confirmation: ETT inserted through vocal cords under direct vision,  positive ETCO2 and breath sounds checked- equal and bilateral Secured at: 22 cm Tube secured with: Tape Dental Injury: Teeth and Oropharynx as per pre-operative assessment  Comments: Placed by Paschal DoppKisan Patel, SRNA

## 2017-11-22 NOTE — Anesthesia Postprocedure Evaluation (Signed)
Anesthesia Post Note  Patient: Warren MuskratRobert Cleary  Procedure(s) Performed: LAPAROSCOPIC CHOLECYSTECTOMY WITH INTRAOPERATIVE CHOLANGIOGRAM (N/A Abdomen)     Patient location during evaluation: PACU Anesthesia Type: General Level of consciousness: awake and alert Pain management: pain level controlled Vital Signs Assessment: post-procedure vital signs reviewed and stable Respiratory status: spontaneous breathing, nonlabored ventilation, respiratory function stable and patient connected to nasal cannula oxygen Cardiovascular status: blood pressure returned to baseline and stable Postop Assessment: no apparent nausea or vomiting Anesthetic complications: no    Last Vitals:  Vitals:   11/22/17 1558 11/22/17 1650  BP: 128/81 (!) 146/82  Pulse: (!) 56 (!) 57  Resp: 14 14  Temp: (!) 36.4 C   SpO2: 97% 98%    Last Pain:  Vitals:   11/22/17 1650  TempSrc:   PainSc: 0-No pain                 Kayela Humphres EDWARD

## 2017-11-22 NOTE — Discharge Instructions (Signed)
[facility] ______[facility] °LAPAROSCOPIC SURGERY: POST OP INSTRUCTIONS °Always review your discharge instruction sheet given to you by the facility where your surgery was performed. °IF YOU HAVE DISABILITY OR FAMILY LEAVE FORMS, YOU MUST BRING THEM TO THE OFFICE FOR PROCESSING.   °DO NOT GIVE THEM TO YOUR DOCTOR. ° °1. A prescription for pain medication may be given to you upon discharge.  Take your pain medication as prescribed, if needed.  If narcotic pain medicine is not needed, then you may take acetaminophen (Tylenol) or ibuprofen (Advil) as needed. °2. Take your usually prescribed medications unless otherwise directed. °3. If you need a refill on your pain medication, please contact your pharmacy.  They will contact our office to request authorization. Prescriptions will not be filled after 5pm or on week-ends. °4. You should follow a light diet the first few days after arrival home, such as soup and crackers, etc.  Be sure to include lots of fluids daily. °5. Most patients will experience some swelling and bruising in the area of the incisions.  Ice packs will help.  Swelling and bruising can take several days to resolve.  °6. It is common to experience some constipation if taking pain medication after surgery.  Increasing fluid intake and taking a stool softener (such as Colace) will usually help or prevent this problem from occurring.  A mild laxative (Milk of Magnesia or Miralax) should be taken according to package instructions if there are no bowel movements after 48 hours. °7. Unless discharge instructions indicate otherwise, you may remove your bandages 24-48 hours after surgery, and you may shower at that time.  You may have steri-strips (small skin tapes) in place directly over the incision.  These strips should be left on the skin for 7-10 days.  If your surgeon used skin glue on the incision, you may shower in 24 hours.  The glue will flake off over the next 2-3 weeks.  Any sutures or  staples will be removed at the office during your follow-up visit. °8. ACTIVITIES:  You may resume regular (light) daily activities beginning the next day--such as daily self-care, walking, climbing stairs--gradually increasing activities as tolerated.  You may have sexual intercourse when it is comfortable.  Refrain from any heavy lifting or straining until approved by your doctor. °a. You may drive when you are no longer taking prescription pain medication, you can comfortably wear a seatbelt, and you can safely maneuver your car and apply brakes. °b. RETURN TO WORK:  __________________________________________________________ °9. You should see your doctor in the office for a follow-up appointment approximately 2-3 weeks after your surgery.  Make sure that you call for this appointment within a day or two after you arrive home to insure a convenient appointment time. °10. OTHER INSTRUCTIONS: __________________________________________________________________________________________________________________________ __________________________________________________________________________________________________________________________ °WHEN TO CALL YOUR DOCTOR: °1. Fever over 101.0 °2. Inability to urinate °3. Continued bleeding from incision. °4. Increased pain, redness, or drainage from the incision. °5. Increasing abdominal pain ° °The clinic staff is available to answer your questions during regular business hours.  Please don’t hesitate to call and ask to speak to one of the nurses for clinical concerns.  If you have a medical emergency, go to the nearest emergency room or call 911.  A surgeon from [facility] is always on call at the hospital. °[address], [address], [place], [place]  [address] ? [address] [address], [place], [place]   [address] °[phone] ? [phone] ? FAX [phone] °Web site:   [email or web address] ° °General Anesthesia, Adult, Care After °These instructions  provide you with information about caring for yourself after your procedure. Your health care provider may also give you more specific instructions. Your treatment has been planned according to current medical practices, but problems sometimes occur. Call your health care provider if you have any problems or questions after your procedure. °What can I expect after the procedure? °After the procedure, it is common to have: °· Vomiting. °· A sore throat. °· Mental slowness. ° °It is common to feel: °· Nauseous. °· Cold or shivery. °· Sleepy. °· Tired. °· Sore or achy, even in parts of your body where you did not have surgery. ° °Follow these instructions at home: °For at least 24 hours after the procedure: °· Do not: °? Participate in activities where you could fall or become injured. °? Drive. °? Use heavy machinery. °? Drink alcohol. °? Take sleeping pills or medicines that cause drowsiness. °? Make important decisions or sign legal documents. °? Take care of children on your own. °· Rest. °Eating and drinking °· If you vomit, drink water, juice, or soup when you can drink without vomiting. °· Drink enough fluid to keep your urine clear or pale yellow. °· Make sure you have little or no nausea before eating solid foods. °· Follow the diet recommended by your health care provider. °General instructions °· Have a responsible adult stay with you until you are awake and alert. °· Return to your normal activities as told by your health care provider. Ask your health care provider what activities are safe for you. °· Take over-the-counter and prescription medicines only as told by your health care provider. °· If you smoke, do not smoke without supervision. °· Keep all follow-up visits as told by your health care provider. This is important. °Contact a health care provider if: °· You continue to have nausea or vomiting at home, and medicines are not helpful. °· You cannot drink fluids or start eating again. °· You cannot  urinate after 8-12 hours. °· You develop a skin rash. °· You have fever. °· You have increasing redness at the site of your procedure. °Get help right away if: °· You have difficulty breathing. °· You have chest pain. °· You have unexpected bleeding. °· You feel that you are having a life-threatening or urgent problem. °This information is not intended to replace advice given to you by your health care provider. Make sure you discuss any questions you have with your health care provider. °Document Released: 01/07/2001 Document Revised: 03/05/2016 Document Reviewed: 09/15/2015 °Elsevier Interactive Patient Education © 2018 [employer] Inc[employer] ° ° °

## 2017-11-23 ENCOUNTER — Encounter (HOSPITAL_COMMUNITY): Payer: Self-pay | Admitting: General Surgery

## 2017-11-26 ENCOUNTER — Ambulatory Visit (INDEPENDENT_AMBULATORY_CARE_PROVIDER_SITE_OTHER): Payer: Medicare Other | Admitting: Diagnostic Neuroimaging

## 2017-11-26 ENCOUNTER — Encounter: Payer: Self-pay | Admitting: Diagnostic Neuroimaging

## 2017-11-26 VITALS — BP 145/88 | HR 59 | Wt 128.2 lb

## 2017-11-26 DIAGNOSIS — F03A0 Unspecified dementia, mild, without behavioral disturbance, psychotic disturbance, mood disturbance, and anxiety: Secondary | ICD-10-CM

## 2017-11-26 DIAGNOSIS — F0390 Unspecified dementia without behavioral disturbance: Secondary | ICD-10-CM | POA: Diagnosis not present

## 2017-11-26 NOTE — Progress Notes (Signed)
[facility]  PATIENT: Warren Brooks DOB: [birth year]  REFERRING CLINICIAN: D Bouska HISTORY FROM: patient and daughter REASON FOR VISIT: follow up   HISTORICAL  CHIEF COMPLAINT:  Chief Complaint  Patient presents with  . Memory Loss    rm 7, dgtr- Heather  . Follow-up    3 month    HISTORY OF PRESENT ILLNESS:   UPDATE (11/26/17, VRP): Since last visit, doing well. Tolerating meds. No alleviating or aggravating factors. Overall more calm per daughter. Memory loss continues. Mood is more stable.   PRIOR HPI (08/07/17): 77 year old male here for evaluation of memory loss and dementia. Patient reports some mild memory loss issues over the past 3-5 years.  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  Patient indicates that this diagnosis was first mentioned on an MRI report from 2013.  He does not remember why the MRI scan was ordered at that time.  We do not have a copy of this report.  We do have some office notes from his family medicine clinic starting in 2014.  On those office visit notes patient has diagnosis of attention deficit disorder and Alzheimer's dementia listed.  No details regarding how those diagnoses were made is available. Patient is a retired Pensions consultant.  He was living in [place] with his wife (second marriage).  Tragically this wife committed suicide in June 2018.  Apparently due to the circumstances and family discussions, patient and family decided that he would be better served to move to [place] to spend time with his daughter and son-in-law.  She was under the impression that this would be a temporary arrangement.  After moving to [facility] patient's daughter, son-in-law, and other family members realize that he would be better served to permanently moved to [facility], especially in light of his diagnosis of dementia, frequent falls, and history  of alcohol abuse.  However there has been significant family stress and tension.  Patient insists on moving back to [place].  He feels like he is capable of living alone even though his family members disagree. Patient is accompanied by his son-in-law.  Patient's daughter was not able to come to this visit.   REVIEW OF SYSTEMS: Full 14 system review of systems performed and negative with exception of: only as per HPI.   ALLERGIES: Allergies  Allergen Reactions  . Penicillins Hives and Other (See Comments)    Has patient had a PCN reaction causing immediate rash, facial/tongue/throat swelling, SOB or lightheadedness with hypotension: Unknown Has patient had a PCN reaction causing severe rash involving mucus membranes or skin necrosis: No Has patient had a PCN reaction that required hospitalization: No Has patient had a PCN reaction occurring within the last 10 years: No If all of the above answers are "NO", then may proceed with Cephalosporin use.     HOME MEDICATIONS: Outpatient Medications Prior to Visit  Medication Sig Dispense Refill  . ALPRAZolam (XANAX XR) 0.5 MG 24 hr tablet Take 0.5 mg by mouth daily.  2  . Artificial Tear Ointment (DRY EYES OP) Place 1 drop into both eyes daily as needed (for dry eyes).    . Cholecalciferol (VITAMIN D3 PO) Take 1 capsule by mouth daily.    . citalopram (CELEXA) 20 MG tablet Take 20 mg by mouth daily.     Marland Kitchen docusate sodium (COLACE) 100 MG capsule Take 100 mg by mouth 2 (two) times daily.     Marland Kitchen  donepezil (ARICEPT) 10 MG tablet Take 10 mg by mouth at bedtime.    . gabapentin (NEURONTIN) 400 MG capsule Take 400 mg by mouth 2 (two) times daily.     . Glucosamine-MSM-Hyaluronic Acd (JOINT HEALTH PO) Take 1 tablet by mouth daily.    Marland Kitchen. lisinopril (PRINIVIL,ZESTRIL) 10 MG tablet Take 10 mg by mouth 2 (two) times daily.     . memantine (NAMENDA) 10 MG tablet Take 10 mg by mouth 2 (two) times daily.     . Multiple Vitamins-Minerals (MULTIVITAMIN PO) Take 1  tablet by mouth daily.    Marland Kitchen. acetaminophen (TYLENOL) 500 MG tablet Take 1,000 mg by mouth every 6 (six) hours as needed for moderate pain or headache.    . traMADol (ULTRAM) 50 MG tablet Take 1 tablet (50 mg total) by mouth every 6 (six) hours as needed for up to 5 days. (Patient not taking: Reported on 11/26/2017) 15 tablet 1   No facility-administered medications prior to visit.     PAST MEDICAL HISTORY: Past Medical History:  Diagnosis Date  . Alzheimer disease   . Anxiety   . Hypertension   . Necrotizing fasciitis (HCC)   . Osteoarthritis   . Pelvic fracture (HCC)     PAST SURGICAL HISTORY: Past Surgical History:  Procedure Laterality Date  . AMPUTATION ARM Right    Necrotic fascitis  . CHOLECYSTECTOMY N/A 11/22/2017   Procedure: LAPAROSCOPIC CHOLECYSTECTOMY WITH INTRAOPERATIVE CHOLANGIOGRAM;  Surgeon: Glenna FellowsHoxworth, Benjamin, MD;  Location: [facility];  Service: General;  Laterality: N/A;    FAMILY HISTORY: Family History  Problem Relation Age of Onset  . Colon cancer Neg Hx   . Heart disease Neg Hx   . Pancreatic cancer Neg Hx   . Stroke Neg Hx     SOCIAL HISTORY:  Social History   Socioeconomic History  . Marital status: Widowed    Spouse name: Not on file  . Number of children: Not on file  . Years of education: Not on file  . Highest education level: Not on file  Social Needs  . Financial resource strain: Not on file  . Food insecurity - worry: Not on file  . Food insecurity - inability: Not on file  . Transportation needs - medical: Not on file  . Transportation needs - non-medical: Not on file  Occupational History    Comment: lawyer  Tobacco Use  . Smoking status: Never Smoker  . Smokeless tobacco: Never Used  Substance and Sexual Activity  . Alcohol use: Yes  . Drug use: No  . Sexual activity: Not on file  Other Topics Concern  . Not on file  Social History Narrative   Retired Pensions consultantattorney, widowed    Living with daughter, [name] and family   2 daughters    2 EtOH/day    3-4 caffeine/day     PHYSICAL EXAM  GENERAL EXAM/CONSTITUTIONAL: Vitals:  Vitals:   11/26/17 0921  BP: (!) 145/88  Pulse: (!) 59  Weight: 128 lb 3.2 oz (58.2 kg)   Body mass index is 22.01 kg/m. No exam data present  Patient is in no distress; well developed, nourished and groomed; neck is supple  CARDIOVASCULAR:  Examination of carotid arteries is normal; no carotid bruits  Regular rate and rhythm, no murmurs  Examination of peripheral vascular system by observation and palpation is normal  EYES:  Ophthalmoscopic exam of optic discs and posterior segments is normal; no papilledema or hemorrhages   MUSCULOSKELETAL: Gait, strength, tone, movements noted in Neurologic  exam below  NEUROLOGIC: MENTAL STATUS:  MMSE - Mini Mental State Exam 11/26/2017 08/07/2017  Orientation to time 4 3  Orientation to Place 3 2  Registration 3 3  Attention/ Calculation 1 2  Recall 1 2  Language- name 2 objects 2 2  Language- repeat 0 0  Language- follow 3 step command 3 3  Language- follow 3 step command-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 -  Language- read & follow direction 1 1  Write a sentence 0 0  Write a sentence-comments no subject -  Copy design 1 1  Total score 19 19    awake, alert, oriented to person  DECR MEMORY and remote memory intact  normal attention and concentration  language fluent, comprehension intact, naming intact,   fund of knowledge appropriate  CALM  CRANIAL NERVE:   2nd - no papilledema on fundoscopic exam  2nd, 3rd, 4th, 6th - pupils equal and reactive to light, visual fields full to confrontation, extraocular muscles intact, no nystagmus  5th - facial sensation symmetric  7th - facial strength symmetric  8th - hearing intact  9th - palate elevates symmetrically, uvula midline  11th - shoulder shrug symmetric  12th - tongue protrusion midline  MOTOR:   normal bulk and tone, full strength in the BUE,  BLE  RIGHT ARM AMPUTATION  SENSORY:   normal and symmetric to light touch, temperature  ABSENT VIB AT TOES AND ANKLES  COORDINATION:   finger-nose-finger, fine finger movements normal  REFLEXES:   deep tendon reflexes TRACE and symmetric  GAIT/STATION:   narrow based gait; SLOW AND UNSTEADY GAIT    DIAGNOSTIC DATA (LABS, IMAGING, TESTING) - I reviewed patient records, labs, notes, testing and imaging myself where available.  Lab Results  Component Value Date   WBC 3.7 (L) 11/22/2017   HGB 11.3 (L) 11/22/2017   HCT 33.4 (L) 11/22/2017   MCV 92.8 11/22/2017   PLT 292 11/22/2017      Component Value Date/Time   NA 134 (L) 11/22/2017 1130   K 3.6 11/22/2017 1130   CL 103 11/22/2017 1130   CO2 25 11/22/2017 1130   GLUCOSE 104 (H) 11/22/2017 1130   BUN 17 11/22/2017 1130   CREATININE 0.70 11/22/2017 1130   CALCIUM 9.0 11/22/2017 1130   PROT 6.5 11/22/2017 1130   ALBUMIN 3.9 11/22/2017 1130   AST 35 11/22/2017 1130   ALT 41 11/22/2017 1130   ALKPHOS 127 (H) 11/22/2017 1130   BILITOT 0.5 11/22/2017 1130   GFRNONAA >60 11/22/2017 1130   GFRAA >60 11/22/2017 1130   No results found for: CHOL, HDL, LDLCALC, LDLDIRECT, TRIG, CHOLHDL No results found for: ZOXW9U No results found for: VITAMINB12 No results found for: TSH      ASSESSMENT AND PLAN  77 y.o. year old male here with diagnosis of Alzheimer's dementia since ~2014, on medication, with history of alcohol abuse, ADD, depression and anxiety. Patient now living with family in [place], previously living in [place] with wife.  Patient's wife tragically committed suicide and therefore moved to be closer and more supervised with family.  Previously family stress and tension as patient insists that he is able to live on his own and would like to move back to [place].  Patient's family and friends disagree with this. Now patient is more accepting of his situation.   Ddx: dementia (alzheimer's vs alcohol vs  vascular) +/- ADD, +/- mood d/o  1. Mild dementia      PLAN:  I spent  15 minutes of face to face time with patient. Greater than 50% of time was spent in counseling and coordination of care with patient. In summary we discussed:   - continue donepezil and memantine - continue safety / supervision from family - patient is not a candidate to live alone or drive at this time due to dementia, gait difficulty and poor insight  Return if symptoms worsen or fail to improve, for return to PCP.    Suanne Marker, MD 11/26/2017, 9:34 AM Certified in Neurology, Neurophysiology and Neuroimaging  [facility] [address] [place], [place] [address] [phone]

## 2020-10-18 ENCOUNTER — Telehealth: Payer: Self-pay

## 2020-10-18 NOTE — Telephone Encounter (Signed)
Agree. -VRP 

## 2020-10-18 NOTE — Telephone Encounter (Signed)
This is a patient of Dr. Richrd Humbles, last seen 11/2017 for Mild dementia. The patient and their daughter are requesting a provider switch from Dr. Marjory Lies to Dr. Vickey Huger regarding the patients dementia. Please advise. Thank you

## 2020-10-18 NOTE — Telephone Encounter (Signed)
Patient is still in a 3 year window-   Please schedule after March 2022 with me, or have Dr Marjory Lies follow up.

## 2020-12-14 ENCOUNTER — Ambulatory Visit (INDEPENDENT_AMBULATORY_CARE_PROVIDER_SITE_OTHER): Payer: Medicare Other | Admitting: Neurology

## 2020-12-14 ENCOUNTER — Encounter: Payer: Self-pay | Admitting: Neurology

## 2020-12-14 VITALS — BP 122/79 | HR 64 | Ht 64.25 in | Wt 138.0 lb

## 2020-12-14 DIAGNOSIS — R441 Visual hallucinations: Secondary | ICD-10-CM

## 2020-12-14 DIAGNOSIS — G3183 Dementia with Lewy bodies: Secondary | ICD-10-CM

## 2020-12-14 DIAGNOSIS — F0281 Dementia in other diseases classified elsewhere with behavioral disturbance: Secondary | ICD-10-CM

## 2020-12-14 DIAGNOSIS — F02818 Dementia in other diseases classified elsewhere, unspecified severity, with other behavioral disturbance: Secondary | ICD-10-CM | POA: Insufficient documentation

## 2020-12-14 DIAGNOSIS — G4752 REM sleep behavior disorder: Secondary | ICD-10-CM

## 2020-12-14 MED ORDER — NUPLAZID 10 MG PO TABS: ORAL_TABLET | ORAL | 1 refills | Status: DC
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
## 2020-12-14 NOTE — Patient Instructions (Signed)
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   I have the pleasure of meeting with Warren Brooks and his daughter today today, he had the first onset.today He had the first onset about 10 years ago.  He has progressed slowly and has developed more symptoms typical of LBD   I like for the patient to  take Aricept now in AM to reduce the vivid dreams, I like him to take the SSRI citalopram in the evening to suppress REM sleep and start on Nuplazid. No change to namenda.   NEW : Starting NUPLAZID  Pimavanserin oral tablet or capsule What is this medicine? PIMAVANSERIN (Pi ma VAN ser in) is used to treat hallucinations (hearing voices, seeing things that are not there) and delusions (having beliefs that are not true) associated with Parkinson's disease. This medicine may be used for other purposes; ask your health care provider or pharmacist if you have questions. COMMON BRAND NAME(S): NUPLAZID What should I tell my health care provider before I take this medicine? They need to know if you have any of these conditions:  dementia  heart disease  history of irregular heartbeat  kidney disease  low levels of magnesium or potassium in the blood  an unusual or allergic reaction to pimavanserin, other medicines, foods, dyes, or preservatives  pregnant or trying to get pregnant  breast-feeding How should I use this medicine? Take this medicine by mouth with a glass of water. Follow the directions on the prescription label. Swallow the capsules whole. You can take it with or without food. You may open the capsule and put the contents in 1 tablespoon of applesauce, yogurt, pudding, or a liquid nutritional supplement. Swallow the medicine and food mixture right away. Do not chew the medicine or food. Take your medicine at regular intervals. Do not take it more often than directed. Do not stop taking except on your doctor's  advice. Talk to your pediatrician regarding the use of this medicine in children. This medicine is not approved for use in children. Overdosage: If you think you have taken too much of this medicine contact a poison control center or emergency room at once. NOTE: This medicine is only for you. Do not share this medicine with others. What if I miss a dose? If you miss a dose, take it as soon as you can. If it is almost time for your next dose, take only that dose. Do not take double or extra doses. What may interact with this medicine? Do not take this medicine with any of the following medications:  certain medicines for fungal infections like fluconazole, itraconazole, ketoconazole, posaconazole, voriconazole  cisapride  dronedarone  pimozide  thioridazine This medicine may also interact with the following medications:  carbamazepine  certain antivirals for HIV or hepatitis like ritonavir  certain antibiotics like clarithromycin, gatifloxacin, moxifloxacin  certain medicines for irregular heart beat like amiodarone, disopyramide, propafenone, quinidine, sotalol  fosphenytoin  modafinil  nafcillin  phenobarbital  primidone  phenytoin  other medicines for psychotic disturbances  other medicines that prolong the QT interval (cause an abnormal heart rhythm) like dofetilide  rifampin  St. John's Wort This list may not describe all possible interactions. Give your health care provider a list of all the medicines, herbs, non-prescription drugs, or dietary supplements you use. Also tell them if you smoke, drink alcohol, or use illegal drugs. Some items may interact with your medicine. What should I watch  for while using this medicine? Visit your doctor or health care professional for regular checks on your progress. Tell your doctor or healthcare professional if your symptoms do not start to get better or if they get worse. What side effects may I notice from receiving this  medicine? Side effects that you should report to your doctor or health care professional as soon as possible:  allergic reactions like skin rash, itching or hives, swelling of the face, lips, or tongue  breathing problems  confusion  falls  increase in hallucination, loss of contact with reality  loss of balance or coordination  signs and symptoms of a dangerous change in heartbeat or heart rhythm like chest pain; dizziness; fast or irregular heartbeat; palpitations; feeling faint or lightheaded, falls; breathing problems  swelling of the ankles, feet, hands Side effects that usually do not require medical attention (report to your doctor or health care professional if they continue or are bothersome):  constipation  drowsiness  nausea  tiredness This list may not describe all possible side effects. Call your doctor for medical advice about side effects. You may report side effects to FDA at 1-800-FDA-1088. Where should I keep my medicine? Keep out of the reach of children. Store at room temperature between 15 and 30 degrees C (59 and 86 degrees F). Throw away any unused medicine after the expiration date. NOTE: This sheet is a summary. It may not cover all possible information. If you have questions about this medicine, talk to your doctor, pharmacist, or health care provider.  2021 Elsevier/Gold Standard (2019-09-14 12:26:17)    Lewy Body Dementia Lewy body dementia, also called dementia with Lewy bodies, is a condition that affects the way the brain functions. It is one form of dementia. In this condition, proteins called Lewy bodies build up in certain areas of the brain. This causes problems with:  Memory.  Decision making.  Behavior.  Speaking.  Thinking and problem solving.  Movements and balance. This condition is progressive, which means that it gets worse with time (is degenerative) and cannot be reversed. What are the causes? This condition is caused by  the buildup of Lewy bodies in brain cells in areas of the brain that control memory, thinking, and movement. It is not known what causes the Lewy bodies to build up. What increases the risk? You are more likely to develop this condition if you:  Have a family history of Lewy body dementia or Parkinson's disease.  Are 63 years old or older.  Are male. What are the signs or symptoms? Symptoms of this condition may include:  Seeing things that are not there (hallucinating).  Sleep problems, such as acting out dreams while you are asleep.  Changes in memory, attention, and concentration that come and go (fluctuate).  Symptoms of dementia, such as: ? Trouble with memory. ? Trouble paying attention. ? Problems with planning and organizing. ? Problems with judgment. ? Behavioral problems.  Symptoms of Parkinson's disease, such as: ? Shaking movements that you cannot control (tremor). Tremors usually start in a hand or foot when you are resting (resting tremor). ? Stooped posture. ? Slowing of movement. ? Stiff muscles (rigidity). ? Loss of balance and stability when standing. How is this diagnosed? This condition is diagnosed by a specialist who diagnoses and treats this condition (neurologist). Your health care provider will talk with you and your family, friends, or caregivers about your history and symptoms. A thorough medical history will be taken, and you will have  a physical exam and tests. Tests may include:  Lab tests, such as blood or urine tests.  Imaging tests, such as a CT scan, a PET scan, or an MRI.  A test that involves removing and testing a small amount of the fluid that surrounds the brain and spinal cord (lumbar puncture).  Tests that evaluate brain function, such as memory tests, cognitive tests, and neuropsychological tests. How is this treated? There is no cure for this condition. Treatment focuses on managing your symptoms. Treatment may  include:  Medicines to help with hallucinations, sleep, and behavioral problems.  Speech, occupational, and physical therapy. Your health care provider can help direct you to support groups, organizations, and other health care providers who can help with decisions about your care. Follow these instructions at home: Medicines  Take over-the-counter and prescription medicines only as told by your health care provider.  To help you manage your medicines, use a pill organizer or pill reminder.  Avoid taking medicines that can affect thinking, such as pain medicines or certain sleeping medicines. Always check with your health care provider before taking any new medicines. Lifestyle  Make healthy lifestyle choices: ? Be physically active as told by your health care provider. ? Do not use any products that contain nicotine or tobacco, such as cigarettes, e-cigarettes, and chewing tobacco. If you need help quitting, ask your health care provider. ? Try to practice stress-management techniques when you experience stress, such as mindfulness, yoga, or deep breathing. ? Stay socially connected. Talk regularly with other people, such as family, friends, and neighbors.  Make sure you sleep well. These tips can help you get a good night's rest: ? Avoid napping during the day. ? Keep your sleeping area dark and cool. ? Avoid exercising a few hours before you go to bed. ? Avoid caffeine products in the evening. Eating and drinking  Do not drink alcohol.  Drink enough fluid to keep your urine pale yellow.  Eat a healthy diet. Safety  Work with your health care provider to determine what you need help with and what your safety needs are.  If you have trouble moving around, use a cane or walker as told by your health care provider.  Make sure your home environment is safe. To do this: ? Remove things that can be a tripping hazard, such as throw rugs or clutter. ? Install grab bars and  railings in your home to prevent falls.  Talk with your health care provider about whether it is safe for you to drive.  If you were given a bracelet that identifies you as a person with memory loss or tracks your location, make sure to wear it at all times.   General instructions  Work with your family to make important decisions, such as advance directives, medical power of attorney, or a living will.  Keep all follow-up visits. This is important.   Where to find more information  [employer] of Neurological Disorders and Stroke: [email or web address] Contact a health care provider if you have:  New or worsening confusion.  New or worsening trouble with sleeping or increased daytime sleepiness.  Problems with choking or swallowing. Get help right away if:  You feel depressed or sad, or feel that you want to harm yourself.  Your family members become concerned for your safety. If you ever feel like you may hurt yourself or others, or have thoughts about taking your own life, get help right away. Go to your nearest emergency department  or:  Call your local emergency services (911 in the [place]  Call a suicide crisis helpline, such as the National Suicide Prevention Lifeline at 564 052 1088. This is open 24 hours a day in the [place]  Text the Crisis Text Line at 780-776-6064 (in the [place] Summary  Lewy body dementia is a condition that affects the way the brain functions. It causes problems with functions such as memory and thinking.  Lewy body dementia gets worse with time.  This condition is caused by the buildup of proteins called Lewy bodies in brain cells. It is not known what causes the Lewy bodies to build up.  Work with your health care provider to determine what you need help with and what your safety needs are.  Your health care provider can help direct you to support groups, organizations, and other health care providers who can help with decisions about your  care. This information is not intended to replace advice given to you by your health care provider. Make sure you discuss any questions you have with your health care provider. Document Revised: 02/15/2020 Document Reviewed: 02/15/2020 Elsevier Patient Education  2021 [employer].

## 2020-12-14 NOTE — Addendum Note (Signed)
Addended by: Melvyn Novas on: 12/14/2020 03:05 PM   Modules accepted: Orders

## 2020-12-15 ENCOUNTER — Telehealth: Payer: Self-pay | Admitting: Neurology

## 2020-12-15 NOTE — Telephone Encounter (Signed)
Medicare/cigna order sent to [facility]. They will reach out to the patient to scheduled and obtain auth for cigna.

## 2020-12-16 ENCOUNTER — Telehealth: Payer: Self-pay | Admitting: Neurology

## 2020-12-16 NOTE — Telephone Encounter (Signed)
Lea from [employer] [place] is required for Pimavanserin Tartrate (NUPLAZID) 10 MG TABS.  Best contact: [phone]

## 2020-12-19 ENCOUNTER — Other Ambulatory Visit: Payer: Self-pay | Admitting: Neurology

## 2020-12-19 MED ORDER — QUETIAPINE FUMARATE 25 MG PO TABS: 12.5000 mg | ORAL_TABLET | Freq: Every day | ORAL | 0 refills | Status: AC

## 2020-12-19 MED ORDER — NUPLAZID 34 MG PO CAPS: 34.0000 mg | ORAL_CAPSULE | Freq: Every day | ORAL | 5 refills | Status: DC

## 2020-12-19 NOTE — Telephone Encounter (Signed)
Reference Number: [id number]. NUPLAZID CAP 34MG  is approved through 12/19/2021.

## 2020-12-19 NOTE — Telephone Encounter (Signed)
Called the patient's daughter back.  Patient has had increase in tremors, hallucinations as well as insomnia.  This increased and started after he started the medication.  He has had the medicine Friday, Saturday and Sunday.  She feels the nuplazid is worsening the symptoms.  She no longer wants to continue this medication for him.  She also wanted to make Dr. Vickey Huger aware that they have made the decision to place Warren Brooks in a senior living facility.  Next week he will move into [facility]. She is asking what we can do to help with the hallucinations, insomnia and also if there is anything we can do to prepare for the transition to another facility to cause less agitation and disruption if possible. Informed the patient's daughter I would speak to Dr. Vickey Huger and contact back with what she recommends.

## 2020-12-19 NOTE — Addendum Note (Signed)
Addended by: Judi Cong on: 12/19/2020 09:58 AM   Modules accepted: Orders

## 2020-12-19 NOTE — Telephone Encounter (Signed)
PA submitted through [employer] [facility] on CMM.  [id number] Will await response

## 2020-12-19 NOTE — Telephone Encounter (Signed)
Called the daughter back and advised that Dr. Vickey Huger agrees with stopping the nuplazid. She recommends that we start the patient at bedtime with low dose seroquel. Order has been sent for the patient to the pharmacy for 25 mg, instructions are patient should take half tablet at bedtime. Pt's daughter verbalized understanding and had no further questions

## 2020-12-19 NOTE — Telephone Encounter (Signed)
Pt.'s daughter Herbert Seta is on [place]. She states that dad's hands are trembling a lot, he's hallucinating & has insomnia. Please advise.

## 2021-01-02 ENCOUNTER — Ambulatory Visit
Admission: RE | Admit: 2021-01-02 | Discharge: 2021-01-02 | Disposition: A | Discharge status: Home / Self Care | Payer: Medicare Other | Source: Ambulatory Visit | Attending: Neurology | Admitting: Neurology

## 2021-01-02 ENCOUNTER — Other Ambulatory Visit: Payer: Self-pay

## 2021-01-02 DIAGNOSIS — F02818 Dementia in other diseases classified elsewhere, unspecified severity, with other behavioral disturbance: Secondary | ICD-10-CM

## 2021-01-02 DIAGNOSIS — G4752 REM sleep behavior disorder: Secondary | ICD-10-CM

## 2021-01-02 DIAGNOSIS — F0281 Dementia in other diseases classified elsewhere with behavioral disturbance: Secondary | ICD-10-CM | POA: Diagnosis not present

## 2021-01-02 DIAGNOSIS — R441 Visual hallucinations: Secondary | ICD-10-CM | POA: Diagnosis not present

## 2021-01-02 DIAGNOSIS — G3183 Dementia with Lewy bodies: Secondary | ICD-10-CM | POA: Diagnosis not present

## 2021-01-02 MED ORDER — GADOBENATE DIMEGLUMINE 529 MG/ML IV SOLN
13.0000 mL | Freq: Once | INTRAVENOUS | Status: AC | PRN
  Administered 2021-01-02: 13 mL via INTRAVENOUS

## 2021-01-03 NOTE — Progress Notes (Signed)
IMPRESSION: This MRI of the brain with and without contrast shows the following: 1.   Moderate generalized cortical atrophy, most pronounced in the temporal lobes. 2.   T2/FLAIR hyperintense foci in the hemispheres consistent with chronic microvascular ischemic changes. 3.   Right mastoid effusion, likely due to eustachian tube dysfunction. 4.   No acute findings.  Normal enhancement pattern.   INTERPRETING PHYSICIAN:  Richard A. Epimenio Foot, MD, PhD, [place]

## 2021-01-04 ENCOUNTER — Ambulatory Visit: Payer: Medicare Other | Admitting: Neurology

## 2021-01-05 ENCOUNTER — Telehealth: Payer: Self-pay | Admitting: Neurology

## 2021-01-05 NOTE — Telephone Encounter (Signed)
-----   Message from Melvyn Novas, MD sent at 01/03/2021  8:51 AM EDT ----- IMPRESSION: This MRI of the brain with and without contrast shows the following: 1.   Moderate generalized cortical atrophy, most pronounced in the temporal lobes. 2.   T2/FLAIR hyperintense foci in the hemispheres consistent with chronic microvascular ischemic changes. 3.   Right mastoid effusion, likely due to eustachian tube dysfunction. 4.   No acute findings.  Normal enhancement pattern.   INTERPRETING PHYSICIAN:  Richard A. Epimenio Foot, MD, PhD, [place]

## 2021-01-05 NOTE — Telephone Encounter (Signed)
Called the daughter and reviewed the MRI results in detail.  Advised of the findings.  She verbalized understanding and had no questions at this time.  She requested that a copy of the report be sent to the address on file.

## 2021-01-06 ENCOUNTER — Ambulatory Visit: Payer: Medicare Other | Admitting: Neurology

## 2021-01-09 ENCOUNTER — Other Ambulatory Visit: Payer: Medicare Other

## 2021-01-13 ENCOUNTER — Telehealth: Payer: Self-pay | Admitting: Neurology

## 2021-01-13 NOTE — Telephone Encounter (Signed)
Pt's daughter Skylur F[place]on on [place] called needing to speak to the RN regarding the pt's medications and also would like to know if he can get a prescription written out for PT,OT and SLP to the assistant living home he is now in. Please advise.

## 2021-01-16 NOTE — Telephone Encounter (Signed)
Called the daughter back. There was no answer. LVM asking for a call back.   If daughter calls back and I am unable to take call, please advise we can attempt to write for the PT,OT and ST for the patient. Please get the name of the facility is possible so we know where to send the order.  Can also ask more details on what questions she has about medications.

## 2021-06-21 ENCOUNTER — Other Ambulatory Visit: Payer: Self-pay

## 2021-06-21 ENCOUNTER — Ambulatory Visit (INDEPENDENT_AMBULATORY_CARE_PROVIDER_SITE_OTHER): Payer: Medicare Other | Admitting: Adult Health

## 2021-06-21 ENCOUNTER — Encounter: Payer: Self-pay | Admitting: Adult Health

## 2021-06-21 VITALS — BP 117/74 | HR 56 | Ht 62.0 in | Wt 134.8 lb

## 2021-06-21 DIAGNOSIS — G3183 Dementia with Lewy bodies: Secondary | ICD-10-CM | POA: Diagnosis not present

## 2021-06-21 DIAGNOSIS — F0281 Dementia in other diseases classified elsewhere with behavioral disturbance: Secondary | ICD-10-CM
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
## 2021-12-20 ENCOUNTER — Ambulatory Visit: Payer: Medicare Other | Admitting: Adult Health

## 2021-12-20 ENCOUNTER — Emergency Department (HOSPITAL_COMMUNITY)
Admission: EM | Admit: 2021-12-20 | Discharge: 2021-12-20 | Disposition: A | Discharge status: Home / Self Care | Payer: Medicare Other | Attending: Emergency Medicine | Admitting: Emergency Medicine

## 2021-12-20 ENCOUNTER — Encounter (HOSPITAL_COMMUNITY): Payer: Self-pay

## 2021-12-20 ENCOUNTER — Other Ambulatory Visit: Payer: Self-pay

## 2021-12-20 DIAGNOSIS — F0390 Unspecified dementia without behavioral disturbance: Secondary | ICD-10-CM | POA: Insufficient documentation

## 2021-12-20 DIAGNOSIS — G309 Alzheimer's disease, unspecified: Secondary | ICD-10-CM | POA: Diagnosis not present

## 2021-12-20 DIAGNOSIS — R404 Transient alteration of awareness: Secondary | ICD-10-CM | POA: Insufficient documentation

## 2021-12-20 NOTE — ED Provider Notes (Signed)
?[facility]-EMERGENCY DEPT ?Provider Note ? ? ?CSN: [id number] ?Arrival date & time: 12/20/21  0916 ? ?  ? ?History ? ?No chief complaint on file. ? ? ?Warren Brooks is a 81 y.o. male. ? ?HPI ?Patient presents by EMS from his facility where he had a period of decreased responsiveness.  Per patient's daughter, Herbert Seta, he was sitting at breakfast when he slumped forward.  EMS was called and arrived.  They checked his CBG and it was 101.  He did not require any additional assistance by them.  Patient's daughter was here on arrival and stated he looked normal then.  He has had no decompensation since arrival. ?  ? ?Home Medications ?Prior to Admission medications   ?Medication Sig Start Date End Date Taking? Authorizing Provider  ?acetaminophen (TYLENOL) 500 MG tablet Take 1,000 mg by mouth every 6 (six) hours as needed for moderate pain or headache.    [provider]  ?Cholecalciferol (VITAMIN D3 PO) Take 1 capsule by mouth daily.    [provider]  ?citalopram (CELEXA) 20 MG tablet Take 20 mg by mouth daily.  06/23/17   [provider]  ?docusate sodium (COLACE) 100 MG capsule Take 100 mg by mouth 2 (two) times daily.     [provider]  ?donepezil (ARICEPT) 10 MG tablet Take 10 mg by mouth at bedtime.    [provider]  ?gabapentin (NEURONTIN) 400 MG capsule Take 400 mg by mouth 2 (two) times daily.  06/21/17   [provider]  ?Glucosamine-MSM-Hyaluronic Acd (JOINT HEALTH PO) Take 1 tablet by mouth daily.    [provider]  ?lisinopril (ZESTRIL) 10 MG tablet Take by mouth. 12/12/20   [provider]  ?memantine (NAMENDA) 10 MG tablet Take 10 mg by mouth 2 (two) times daily.  06/21/17   [provider]  ?Multiple Vitamins-Minerals (MULTIVITAMIN PO) Take 1 tablet by mouth daily.    [provider]  ?QUEtiapine (SEROQUEL) 25 MG tablet Take 0.5 tablets (12.5 mg total) by mouth at bedtime. 12/19/20   Dohmeier, Porfirio Mylar,  MD  ?   ? ?Allergies    ?Penicillins   ? ?Review of Systems   ?Review of Systems ? ?Physical Exam ?Updated Vital Signs ?BP (!) 148/88 (BP Location: Left Arm)   Pulse (!) 54   Temp 97.6 ?F (36.4 ?C) (Oral)   Resp 19   Ht 5\' 2"  (1.575 m)   Wt 62 kg   SpO2 100%   BMI 25.00 kg/m?  ?Physical Exam ?Vitals and nursing note reviewed.  ?Constitutional:   ?   General: He is not in acute distress. ?   Appearance: He is well-developed. He is not ill-appearing or diaphoretic.  ?HENT:  ?   Head: Normocephalic and atraumatic.  ?   Right Ear: External ear normal.  ?   Left Ear: External ear normal.  ?Eyes:  ?   Conjunctiva/sclera: Conjunctivae normal.  ?   Pupils: Pupils are equal, round, and reactive to light.  ?Neck:  ?   Trachea: Phonation normal.  ?Cardiovascular:  ?   Rate and Rhythm: Normal rate.  ?   Heart sounds: Normal heart sounds.  ?Pulmonary:  ?   Effort: Pulmonary effort is normal.  ?   Breath sounds: Normal breath sounds.  ?Abdominal:  ?   General: There is no distension.  ?   Tenderness: There is no abdominal tenderness.  ?Musculoskeletal:     ?   General: Normal range of motion.  ?  Cervical back: Normal range of motion and neck supple.  ?Skin: ?   General: Skin is warm and dry.  ?Neurological:  ?   Mental Status: He is alert.  ?   Cranial Nerves: No cranial nerve deficit.  ?   Sensory: No sensory deficit.  ?   Motor: No abnormal muscle tone.  ?   Coordination: Coordination normal.  ?   Comments: Alert and interactive.  No dysarthria or aphasia.  He is confused.  ?Psychiatric:     ?   Mood and Affect: Mood normal.     ?   Behavior: Behavior normal.  ? ? ?ED Results / Procedures / Treatments   ?Labs ?(all labs ordered are listed, but only abnormal results are displayed) ?Labs Reviewed - No data to display ? ?EKG ?None ? ?Radiology ?No results found. ? ?Procedures ?Procedures  ? ? ?Medications Ordered in ED ?Medications - No data to display ? ?ED Course/ Medical Decision Making/ A&P ?  ?                         ?Medical Decision Making ?Patient arrived here for evaluation of a transient episode of altered mental status.  He completely resolved at the time of arrival to the emergency department. ? ?Problems Addressed: ?Transient alteration of awareness: acute illness or injury ?   Details: Occurred at breakfast time when he slumped forward.  He recovered quickly. ? ?Amount and/or Complexity of Data Reviewed ?Independent Historian: caregiver ?   Details: Daughter at bedside gives history. ?External Data Reviewed: notes. ?   Details: Last office visit with neurology, September 2022.  At that time he was stable and maintained on current medications.  Plan for every 6 month follow-up.  Follow-up appointment is scheduled for today. ? ?Risk ?Decision regarding hospitalization. ?Risk Details: The patient had a transient period of decreased responsiveness that resolved spontaneously.  His CBG was checked and was normal.  He has Lewy body dementia, and history of Alzheimer's disease.  He has a appointment today with neurology for routine follow-up.  His daughter is going to try to take him to the appointment after leaving the emergency department. ? ? ? ? ? ? ? ? ? ? ?Final Clinical Impression(s) / ED Diagnoses ?Final diagnoses:  ?Transient alteration of awareness  ? ? ?Rx / DC Orders ?ED Discharge Orders   ? ? None  ? ?  ? ? ?  ?Mancel Bale, MD ?12/20/21 8811 ? ?

## 2021-12-20 NOTE — Discharge Instructions (Signed)
Continue your usual treatments. Follow up with your doctor for problems. ?

## 2021-12-20 NOTE — ED Triage Notes (Signed)
Pt BIB [facility] from [facility] for [facility]. Facility states patient became altered this morning. Was found slumped in chair.  ? ?Vital signs were:  ?145/81 ?54-HR ?99% RA ?101-CBG ?

## 2021-12-25 ENCOUNTER — Ambulatory Visit (INDEPENDENT_AMBULATORY_CARE_PROVIDER_SITE_OTHER): Payer: Medicare Other | Admitting: Adult Health

## 2021-12-25 ENCOUNTER — Encounter: Payer: Self-pay | Admitting: Adult Health

## 2021-12-25 VITALS — BP 112/74 | HR 66 | Ht 63.0 in | Wt 145.8 lb

## 2021-12-25 DIAGNOSIS — F02818 Dementia in other diseases classified elsewhere, unspecified severity, with other behavioral disturbance: Secondary | ICD-10-CM | POA: Diagnosis not present

## 2021-12-25 DIAGNOSIS — G3183 Dementia with Lewy bodies: Secondary | ICD-10-CM | POA: Diagnosis not present
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
## 2021-12-25 NOTE — Patient Instructions (Signed)
Continue Aricept, namenda and Seroquel ?If your symptoms worsen or you develop new symptoms please let us know.  ? ?

## 2022-01-29 ENCOUNTER — Ambulatory Visit: Payer: Medicare Other | Admitting: Neurology

## 2022-03-23 ENCOUNTER — Emergency Department (HOSPITAL_COMMUNITY): Payer: Medicare Other

## 2022-03-23 ENCOUNTER — Other Ambulatory Visit: Payer: Self-pay

## 2022-03-23 ENCOUNTER — Encounter (HOSPITAL_COMMUNITY): Payer: Self-pay

## 2022-03-23 ENCOUNTER — Emergency Department (HOSPITAL_COMMUNITY)
Admission: EM | Admit: 2022-03-23 | Discharge: 2022-03-23 | Disposition: A | Discharge status: Skilled Nursing Facility | Payer: Medicare Other | Attending: Emergency Medicine | Admitting: Emergency Medicine

## 2022-03-23 DIAGNOSIS — E162 Hypoglycemia, unspecified: Secondary | ICD-10-CM | POA: Diagnosis not present

## 2022-03-23 DIAGNOSIS — R4182 Altered mental status, unspecified: Secondary | ICD-10-CM | POA: Diagnosis present

## 2022-03-23 DIAGNOSIS — I10 Essential (primary) hypertension: Secondary | ICD-10-CM | POA: Insufficient documentation

## 2022-03-23 DIAGNOSIS — G3183 Dementia with Lewy bodies: Secondary | ICD-10-CM | POA: Insufficient documentation

## 2022-03-23 DIAGNOSIS — R4701 Aphasia: Secondary | ICD-10-CM

## 2022-03-23 DIAGNOSIS — R4189 Other symptoms and signs involving cognitive functions and awareness: Secondary | ICD-10-CM

## 2022-03-23 DIAGNOSIS — Z79899 Other long term (current) drug therapy: Secondary | ICD-10-CM | POA: Insufficient documentation

## 2022-03-23 DIAGNOSIS — G319 Degenerative disease of nervous system, unspecified: Secondary | ICD-10-CM | POA: Insufficient documentation

## 2022-03-23 DIAGNOSIS — R404 Transient alteration of awareness: Secondary | ICD-10-CM | POA: Insufficient documentation

## 2022-03-23 DIAGNOSIS — F0280 Dementia in other diseases classified elsewhere without behavioral disturbance: Secondary | ICD-10-CM | POA: Insufficient documentation

## 2022-03-23 LAB — COMPREHENSIVE METABOLIC PANEL
ALT: 22 U/L (ref 0–44)
AST: 24 U/L (ref 15–41)
Albumin: 3.8 g/dL (ref 3.5–5.0)
Alkaline Phosphatase: 75 U/L (ref 38–126)
Anion gap: 8 (ref 5–15)
BUN: 12 mg/dL (ref 8–23)
CO2: 18 mmol/L — ABNORMAL LOW (ref 22–32)
Calcium: 8.6 mg/dL — ABNORMAL LOW (ref 8.9–10.3)
Chloride: 112 mmol/L — ABNORMAL HIGH (ref 98–111)
Creatinine, Ser: 0.89 mg/dL (ref 0.61–1.24)
GFR, Estimated: >60 mL/min (ref >60)
Glucose, Bld: 77 mg/dL (ref 70–99)
Potassium: 4.4 mmol/L (ref 3.5–5.1)
Sodium: 138 mmol/L (ref 135–145)
Total Bilirubin: 0.5 mg/dL (ref 0.3–1.2)
Total Protein: 6.4 g/dL — ABNORMAL LOW (ref 6.5–8.1)

## 2022-03-23 LAB — DIFFERENTIAL
Abs Immature Granulocytes: 0.06 10*3/uL (ref 0.00–0.07)
Basophils Absolute: 0 10*3/uL (ref 0.0–0.1)
Basophils Relative: 1 %
Eosinophils Absolute: 0.3 10*3/uL (ref 0.0–0.5)
Eosinophils Relative: 6 %
Immature Granulocytes: 1 %
Lymphocytes Relative: 30 %
Lymphs Abs: 1.4 10*3/uL (ref 0.7–4.0)
Monocytes Absolute: 0.6 10*3/uL (ref 0.1–1.0)
Monocytes Relative: 13 %
Neutro Abs: 2.3 10*3/uL (ref 1.7–7.7)
Neutrophils Relative %: 49 %
Smear Review: NORMAL

## 2022-03-23 LAB — URINALYSIS, ROUTINE W REFLEX MICROSCOPIC
Bilirubin Urine: NEGATIVE
Glucose, UA: NEGATIVE mg/dL
Hgb urine dipstick: NEGATIVE
Ketones, ur: NEGATIVE mg/dL
Leukocytes,Ua: NEGATIVE
Nitrite: NEGATIVE
Protein, ur: NEGATIVE mg/dL
Specific Gravity, Urine: 1.028 (ref 1.005–1.030)
pH: 6 (ref 5.0–8.0)

## 2022-03-23 LAB — CBC
HCT: 40.8 % (ref 39.0–52.0)
Hemoglobin: 13.7 g/dL (ref 13.0–17.0)
MCH: 33.1 pg (ref 26.0–34.0)
MCHC: 33.6 g/dL (ref 30.0–36.0)
MCV: 98.6 fL (ref 80.0–100.0)
Platelets: 138 10*3/uL — ABNORMAL LOW (ref 150–400)
RBC: 4.14 MIL/uL — ABNORMAL LOW (ref 4.22–5.81)
RDW: 13 % (ref 11.5–15.5)
WBC: 4.6 10*3/uL (ref 4.0–10.5)
nRBC: 0 % (ref 0.0–0.2)

## 2022-03-23 LAB — I-STAT CHEM 8, ED
BUN: 16 mg/dL (ref 8–23)
Calcium, Ion: 1.11 mmol/L — ABNORMAL LOW (ref 1.15–1.40)
Chloride: 107 mmol/L (ref 98–111)
Creatinine, Ser: 0.9 mg/dL (ref 0.61–1.24)
Glucose, Bld: 76 mg/dL (ref 70–99)
HCT: 40 % (ref 39.0–52.0)
Hemoglobin: 13.6 g/dL (ref 13.0–17.0)
Potassium: 4.4 mmol/L (ref 3.5–5.1)
Sodium: 140 mmol/L (ref 135–145)
TCO2: 23 mmol/L (ref 22–32)

## 2022-03-23 LAB — CBG MONITORING, ED
Glucose-Capillary: 62 mg/dL — ABNORMAL LOW (ref 70–99)
Glucose-Capillary: 81 mg/dL (ref 70–99)
Glucose-Capillary: 77 mg/dL (ref 70–99)

## 2022-03-23 LAB — TSH: TSH: 1.548 u[IU]/mL (ref 0.350–4.500)

## 2022-03-23 LAB — LIPASE, BLOOD: Lipase: 42 U/L (ref 11–51)

## 2022-03-23 LAB — PROTIME-INR
INR: 1.1 (ref 0.8–1.2)
Prothrombin Time: 14.1 seconds (ref 11.4–15.2)

## 2022-03-23 LAB — TROPONIN I (HIGH SENSITIVITY)
Troponin I (High Sensitivity): 4 ng/L (ref <18)
Troponin I (High Sensitivity): 5 ng/L (ref <18)

## 2022-03-23 LAB — AMMONIA: Ammonia: 31 umol/L (ref 9–35)

## 2022-03-23 LAB — APTT: aPTT: 29 seconds (ref 24–36)

## 2022-03-23 MED ORDER — IOHEXOL 350 MG/ML SOLN
100.0000 mL | Freq: Once | INTRAVENOUS | Status: AC | PRN
  Administered 2022-03-23: 100 mL via INTRAVENOUS

## 2022-03-23 MED ORDER — ASPIRIN 81 MG PO CHEW: 81.0000 mg | CHEWABLE_TABLET | Freq: Every day | ORAL | 0 refills | Status: AC

## 2022-03-23 MED ORDER — SODIUM CHLORIDE 0.9% FLUSH
3.0000 mL | Freq: Once | INTRAVENOUS | Status: AC
  Administered 2022-03-23: 3 mL via INTRAVENOUS

## 2022-03-23 MED ORDER — DEXTROSE 50 % IV SOLN
25.0000 mL | Freq: Once | INTRAVENOUS | Status: AC
  Administered 2022-03-23: 25 mL via INTRAVENOUS
  Filled 2022-03-23: qty 50

## 2022-03-23 NOTE — Discharge Instructions (Addendum)
Your testing today shows no evidence of stroke.  No evidence of pneumonia or UTI.  It is possible that you had your mental status change due to low blood sugar.  As we discussed seizure is a possibility and should start a baby aspirin daily and follow-up with a neurologist for a test called an EEG. Return to the ED with new or worsening symptoms

## 2022-03-23 NOTE — Progress Notes (Signed)
  Evaluation after Contrast Extravasation  Patient seen and examined immediately after contrast extravasation while in [facility] CT.  Exam: There is swelling and tenderness at the left antecubital area.  There is no erythema. There is no discoloration. There are no blisters. There are no signs of decreased perfusion of the skin.  It is warm to touch.  The patient has normal ROM in fingers.  Radial pulse is normal.  Per contrast extravasation protocol, I have instructed the patient to keep an ice pack on the area for 20-60 minutes at a time for about 48 hours.   Keep arm elevated as much as possible.   The patient understands to call the radiology department if there is: - increase in pain or swelling - changed or altered sensation - ulceration or blistering - increasing redness - warmth or increasing firmness - decreased tissue perfusion as noted by decreased capillary refill or discoloration of skin - decreased pulses peripheral to site   Mickie Kay, NP 03/23/2022 9:49 AM

## 2022-03-23 NOTE — ED Provider Notes (Signed)
[facility] EMERGENCY DEPARTMENT Provider Note   CSN: [id number] Arrival date & time: 03/23/22  8588     History  No chief complaint on file.   Warren Brooks is a 81 y.o. male.  Patient presents as code stroke from his living facility.  Level 5 caveat applies for dementia and altered mental status.  Patient has a history of Lewy body dementia but is apparently normally able to communicate his needs.  Around 730 this morning there was report of him "acting funny and not feeling well".  He was found to be not speaking and to his left side and code stroke was activated by EMS.  On their initial arrival his blood sugar was 76.  Patient was not speaking at all.  On arrival to the ED he is starting to speak a few words and move all of his extremities equally.  He complains of pain all over. Denies any shortness of breath.  Denies abdominal pain, fever, nausea or vomiting. Past medical history of dementia hypertension only  The history is provided by the patient and the EMS personnel. The history is limited by the condition of the patient.       Home Medications Prior to Admission medications   Medication Sig Start Date End Date Taking? Authorizing Provider  acetaminophen (TYLENOL) 500 MG tablet Take 1,000 mg by mouth every 6 (six) hours as needed for moderate pain or headache.    [provider]  Cholecalciferol (VITAMIN D3 PO) Take 1 capsule by mouth daily.    [provider]  citalopram (CELEXA) 20 MG tablet Take 20 mg by mouth daily.  06/23/17   [provider]  docusate sodium (COLACE) 100 MG capsule Take 100 mg by mouth 2 (two) times daily.     [provider]  donepezil (ARICEPT) 10 MG tablet Take 10 mg by mouth at bedtime.    [provider]  gabapentin (NEURONTIN) 400 MG capsule Take 400 mg by mouth 2 (two) times daily.  06/21/17   [provider]  Glucosamine-MSM-Hyaluronic Acd (JOINT HEALTH PO) Take 1 tablet by  mouth daily.    [provider]  lisinopril (ZESTRIL) 10 MG tablet Take by mouth. 12/12/20   [provider]  memantine (NAMENDA) 10 MG tablet Take 10 mg by mouth 2 (two) times daily.  06/21/17   [provider]  Multiple Vitamins-Minerals (MULTIVITAMIN PO) Take 1 tablet by mouth daily.    [provider]  QUEtiapine (SEROQUEL) 25 MG tablet Take 0.5 tablets (12.5 mg total) by mouth at bedtime. 12/19/20   Dohmeier, Porfirio Mylar, MD      Allergies    Penicillins    Review of Systems   Review of Systems  Unable to perform ROS: Mental status change    Physical Exam Updated Vital Signs BP (!) 158/85   Pulse (!) 57   Temp 98.5 F (36.9 C) (Oral)   Resp 18   Wt 65.6 kg   SpO2 100%   BMI 25.62 kg/m  Physical Exam Vitals and nursing note reviewed.  Constitutional:      General: He is not in acute distress.    Appearance: He is well-developed. He is not ill-appearing.  HENT:     Head: Normocephalic and atraumatic.     Mouth/Throat:     Pharynx: No oropharyngeal exudate.  Eyes:     Conjunctiva/sclera: Conjunctivae normal.     Pupils: Pupils are equal, round, and reactive to light.  Neck:  Comments: No meningismus. Cardiovascular:     Rate and Rhythm: Normal rate and regular rhythm.     Heart sounds: Normal heart sounds. No murmur heard. Pulmonary:     Effort: Pulmonary effort is normal. No respiratory distress.     Breath sounds: Normal breath sounds.  Chest:     Chest wall: No tenderness.  Abdominal:     Palpations: Abdomen is soft.     Tenderness: There is no abdominal tenderness. There is no guarding or rebound.  Musculoskeletal:        General: No tenderness. Normal range of motion.     Cervical back: Normal range of motion and neck supple.     Comments: R arm amputation.  Skin:    General: Skin is warm.  Neurological:     Mental Status: He is alert and oriented to person, place, and time.     Cranial Nerves: No cranial nerve deficit.      Motor: No abnormal muscle tone.     Coordination: Coordination normal.     Comments: On initial arrival to the ED, patient is not speaking.  He is able to move his left arm and bilateral legs against gravity and follow commands.  No appreciable facial droop.  Tongue is midline.  While in CT scan patient did become more alert and was answering a few questions.  He is oriented to person only.  He states he hurts all over.  He is able to follow commands and move his extremities equally.  Psychiatric:        Behavior: Behavior normal.     ED Results / Procedures / Treatments   Labs (all labs ordered are listed, but only abnormal results are displayed) Labs Reviewed  CBC - Abnormal; Notable for the following components:      Result Value   RBC 4.14 (*)    Platelets 138 (*)    All other components within normal limits  COMPREHENSIVE METABOLIC PANEL - Abnormal; Notable for the following components:   Chloride 112 (*)    CO2 18 (*)    Calcium 8.6 (*)    Total Protein 6.4 (*)    All other components within normal limits  I-STAT CHEM 8, ED - Abnormal; Notable for the following components:   Calcium, Ion 1.11 (*)    All other components within normal limits  CBG MONITORING, ED - Abnormal; Notable for the following components:   Glucose-Capillary 62 (*)    All other components within normal limits  DIFFERENTIAL  AMMONIA  TSH  URINALYSIS, ROUTINE W REFLEX MICROSCOPIC  LIPASE, BLOOD  PROTIME-INR  APTT  CBG MONITORING, ED  CBG MONITORING, ED  TROPONIN I (HIGH SENSITIVITY)  TROPONIN I (HIGH SENSITIVITY)    EKG EKG Interpretation  Date/Time:  Friday March 23 2022 09:56:41 EDT Ventricular Rate:  61 PR Interval:  189 QRS Duration: 103 QT Interval:  451 QTC Calculation: 455 R Axis:   40 Text Interpretation: Sinus rhythm Borderline low voltage, extremity leads No significant change was found Confirmed by Glynn Octave [phone]) on 03/23/2022 9:59:01 AM  Radiology CT ANGIO HEAD  NECK W WO CM W PERF (CODE STROKE)  Result Date: 03/23/2022 CLINICAL DATA:  The EXAM: CT ANGIOGRAPHY HEAD AND NECK CT PERFUSION BRAIN TECHNIQUE: Multidetector CT imaging of the head and neck was performed using the standard protocol during bolus administration of intravenous contrast. Multiplanar CT image reconstructions and MIPs were obtained to evaluate the vascular anatomy. Carotid stenosis measurements (when applicable) are obtained  utilizing NASCET criteria, using the distal internal carotid diameter as the denominator. Multiphase CT imaging of the brain was performed following IV bolus contrast injection. Subsequent parametric perfusion maps were calculated using RAPID software. RADIATION DOSE REDUCTION: This exam was performed according to the departmental dose-optimization program which includes automated exposure control, adjustment of the mA and/or kV according to patient size and/or use of iterative reconstruction technique. CONTRAST:  OMNIPAQUE IOHEXOL 350 MG/ML SOLN COMPARISON:  None Available. FINDINGS: CTA NECK Aortic arch: Mild calcified plaque the arch. Great vessel origins are patent. Plaque along the proximal right subclavian causing approximately 50% stenosis. Right carotid system: Patent.  No stenosis. Left carotid system: Patent. Calcified plaque at the bifurcation and proximal internal carotid causing less than 50% stenosis. Vertebral arteries: Patent. Left vertebral is dominant. Calcified plaque at the right vertebral origin likely causing moderate stenosis. Skeleton: Advanced degenerative changes of the cervical spine. Canal stenosis is greatest at C6-C7. Other neck: Unremarkable. Upper chest: No apical lung mass. Review of the MIP images confirms the above findings CTA HEAD Anterior circulation: Intracranial internal carotid arteries are patent with calcified plaque causing mild stenosis. Anterior and middle cerebral arteries are patent. There is atherosclerotic irregularity with  multifocal stenoses. For example, mild to moderate stenosis of the mid to distal right M1 MCA. Posterior circulation: Intracranial vertebral arteries, basilar artery, and posterior cerebral arteries are patent. Mild calcified plaque along the left vertebral. The right vertebral is diminutive after PICA origin. There is atherosclerotic irregularity with moderate stenosis of the right P2 PCA and moderate to marked stenosis of the left P2 PCA. Venous sinuses: Patent as allowed by contrast bolus timing. Review of the MIP images confirms the above findings CT Brain Perfusion Findings: CBF (<30%) Volume: 73mL Perfusion (Tmax>6.0s) volume: 88mL Mismatch Volume: 31mL Infarction Location: None. IMPRESSION: No large vessel occlusion. No evidence of core infarction or penumbra by perfusion imaging. No hemodynamically significant stenosis at the ICA origins. Calcified plaque at the nondominant right vertebral origin likely causes moderate stenosis. Noncalcified plaque along the proximal right subclavian causes approximately 50% stenosis. Intracranial atherosclerosis. Electronically Signed   By: Guadlupe Spanish M.D.   On: 03/23/2022 10:08   CT HEAD CODE STROKE WO CONTRAST  Result Date: 03/23/2022 CLINICAL DATA:  Code stroke.  Neuro deficit, acute, stroke suspected EXAM: CT HEAD WITHOUT CONTRAST TECHNIQUE: Contiguous axial images were obtained from the base of the skull through the vertex without intravenous contrast. RADIATION DOSE REDUCTION: This exam was performed according to the departmental dose-optimization program which includes automated exposure control, adjustment of the mA and/or kV according to patient size and/or use of iterative reconstruction technique. COMPARISON:  MRI head (without report) from Mar 04, 2021. FINDINGS: Brain: No evidence of acute large vascular territory infarction, hemorrhage, hydrocephalus, extra-axial collection or mass lesion/mass effect. Cerebral atrophy. Patchy white matter hypodensities,  nonspecific but compatible with chronic microvascular ischemic disease. Vascular: No hyperdense vessel identified. Calcific intracranial atherosclerosis. Skull: No acute fracture. Sinuses/Orbits: Minimal paranasal sinus mucosal thickening. No acute orbital findings. Other: No mastoid effusions ASPECTS (Alberta Stroke Program Early CT Score) Total score (0-10 with 10 being normal): 10 IMPRESSION: No evidence of acute intracranial abnormality. ASPECTS is 10. Code stroke imaging results were communicated on 03/23/2022 at 9:33 am to provider Dr. Amada Jupiter via secure text paging. Electronically Signed   By: Feliberto Harts M.D.   On: 03/23/2022 09:33    Procedures Procedures    Medications Ordered in ED Medications  sodium chloride flush (NS) 0.9 %  injection 3 mL (has no administration in time range)  dextrose 50 % solution 25 mL (has no administration in time range)    ED Course/ Medical Decision Making/ A&P                           Medical Decision Making Amount and/or Complexity of Data Reviewed Labs: ordered. Decision-making details documented in ED Course. Radiology: ordered and independent interpretation performed. Decision-making details documented in ED Course. ECG/medicine tests: ordered and independent interpretation performed. Decision-making details documented in ED Course.  Risk OTC drugs. Prescription drug management.  Patient presents as code stroke from his facility with difficulty speaking and possibly left-sided weakness.  On arrival his speech is improving and he is moving his extremities equally.  Seen as code stroke on arrival with Dr. Amada JupiterKirkpatrick of neurology.  CT head is negative for hemorrhage. Results reviewed and interpreted by me. Not thrombolytic candidate due to rapidly improving symptoms.  Unclear last seen normal.  CTA shows no large vessel occlusion and CT perfusion is negative. Mild hypoglycemia may be contributing.  D50 is given. Labs are reassuring  and urinalysis is negative.  Chest x-ray is negative for pneumonia.  MRI is negative for acute infarct.  Results reviewed and interpreted by me.  Daughter at bedside confirms patient is back to baseline.  Discussed with Dr. Amada JupiterKirkpatrick of neurology.  He feels unlikely to be stroke and does not feel patient needs to be admitted to the hospital.  Consider episode of hypoglycemia though his blood sugar was never actually dangerously low.  Seizure is considered and neurology is recommending outpatient EEG.  Patient tolerating PO and ambulatory. He is speaking normally and at his baseline per his daughter.  No evidence of infectious etiology of AMS.  Consider hypoglycemia. Consider possible seizure.   Start ASA per neurology with outpatient followup for EEG. Patient stable to return to his facility.       Final Clinical Impression(s) / ED Diagnoses Final diagnoses:  Decreased level of consciousness    Rx / DC Orders ED Discharge Orders     None         Haleemah Buckalew, Jeannett SeniorStephen, MD 03/23/22 2146

## 2022-03-23 NOTE — ED Triage Notes (Signed)
Pt BIB [facility] from [facility]. Per EMS pt told staff at 730am that he had generalized weakness and did not feel right. EMS was called, pt was found to have asphasia, left sided weakness and a facial droop.

## 2022-03-23 NOTE — ED Notes (Signed)
Patient transported to MRI 

## 2022-03-23 NOTE — ED Notes (Signed)
[facility] informed pt is returning to facility and that pt daughter is bringing them back.

## 2022-03-23 NOTE — Consult Note (Signed)
Neurology Consultation Reason for Consult: Altered mental status Referring Physician: Rancourt, S  CC: Altered mental status  History is obtained from: EMS  HPI: Warren Brooks is a 81 y.o. male with a history of Lewy body dementia.  At baseline, he apparently has fairly severe dementia and does not remember things from one moment to the next.  This morning, he was speaking normally at 7:30 AM, but told the nursing home staff that he was not feeling quite right.  Upon the next check you was leaning to the left and was not speaking.  On EMS arrival, they noticed him leaning to the left, but had no focal weakness.  He would follow commands, but would not speak.  This persisted until he was in the CT scanner at [facility], where he said a couple of words.  Following an amp of D50, his speech improved rapidly, but unclear if this was directly a result of D50 or not.  He had no memory of the events that brought him in.   LKW: Unclear, likely last night given report of not feeling well at 7:30 AM. tpa given?: no, rapidly improving symptoms     Past Medical History:  Diagnosis Date   Alzheimer disease (HCC)    Anxiety    Hypertension    Necrotizing fasciitis (HCC)    Osteoarthritis    Pelvic fracture (HCC)      Family History  Problem Relation Age of Onset   Dementia Paternal Grandmother    Colon cancer Neg Hx    Heart disease Neg Hx    Pancreatic cancer Neg Hx    Stroke Neg Hx      Social History:  reports that he has never smoked. He has never used smokeless tobacco. He reports current alcohol use. He reports that he does not use drugs.   Exam: Current vital signs: BP (!) 143/118 (BP Location: Right Arm)   Pulse (!) 57   Temp 98.5 F (36.9 C)   Resp 14   Wt 65.6 kg   SpO2 99%   BMI 25.62 kg/m  Vital signs in last 24 hours: Temp:  [98.5 F (36.9 C)] 98.5 F (36.9 C) (06/09 1004) Pulse Rate:  [57] 57 (06/09 1004) Resp:  [14] 14 (06/09 1004) BP: (143)/(118) 143/118 (06/09  1004) SpO2:  [99 %] 99 % (06/09 1004) Weight:  [65.6 kg] 65.6 kg (06/09 0900)   Physical Exam  Constitutional: Appears well-developed and well-nourished.  Psych: Affect appropriate to situation Eyes: No scleral injection HENT: No OP obstruction MSK: no joint deformities.  Cardiovascular: Normal rate and regular rhythm.  Respiratory: Effort normal, non-labored breathing GI: Soft.  No distension. There is no tenderness.  Skin: WDI  Neuro: Mental Status: Patient is awake, alert, he is not oriented, Initially he was able to follow commands, but had no speech output, upon subsequent evaluation he is able to speak fluently without difficulty. Cranial Nerves: II: Blinks to threat bilaterally. Pupils are equal, round, and reactive to light.   III,IV, VI: Eyes cross midline in both directions VII: Facial movement is asymmetric, with the left side of his face slightly lower than the right, but this appears baseline per epic picture, when he grimaces the, but fairly symmetrically. Motor: He is able to hold his left arm and bilateral legs aloft without drift, he has no right arm  sensory: Endorses sensation is symmetric in his legs and normal in his left arm Cerebellar: No clear ataxia     I have  reviewed labs in epic and the results pertinent to this consultation are: CBG 62  I have reviewed the images obtained: CT head/CTA-negative  Impression: 81 year old male with transient decreased in speech output in the setting of dementia.  With his having no recollection of the event at all, I do think seizure would be one possibility.  I think an MRI is reasonable, but think that stroke/TIA is slightly less likely.  That being said, I think a baby aspirin would be a relatively low risk intervention and would favor starting this.  With his degree of dementia, I think that admission would carry a significant risk of delirium, and would not favor admission if his MRI is negative.  Could consider  outpatient EEG. Certainly with improvement after glucose, hypoglycemia is a consideration, though 62 is usually not in the range that I think of for focal symptoms.   Recommendations: 1) ASA 81 mg daily 2) if MRI is negative, would consider outpatient EEG   Ritta Slot, MD [facility] [phone]  If 7pm- 7am, please page neurology on call as listed in AMION.

## 2022-03-23 NOTE — ED Notes (Signed)
Pt ambulated in the hallway without assistance. Pt exhibits steady gait, no complaints at this time.

## 2022-03-23 NOTE — Code Documentation (Addendum)
Mr. 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 He is on no thinners. Pt was last known in usual state of health today at 0730 per [facility] staff. Upon later check, he was weak and could not speak. EMS dispatched. Code stroke alert paged at 0900 for aphasia.     Pt arrived [facility] at 0912. He was mute but followed commands and moved all extremities. Blood work obtained, CBG obtained (62), and airway cleared by EDP at bridge. Pt taken to CT at 0920. CTNC negative for acute hemorrhage per Dr Amada Jupiter. CTA performed. IV infiltrated, new IV placed proximal to infiltration by EDRN. CTP performed.     Pt returned to room 27. He was then able to speak, and recognize his daughter. NIHSS at this time 4 for slight weakness of left leg, LOC questions and aphasia. Pt Dr. Amada Jupiter, CTA is neg for LVO. Pt will need q 2 hr VS and neuro checks while workup continues.  Bedside handoff with [name], RN.

## 2022-03-30 ENCOUNTER — Emergency Department (HOSPITAL_COMMUNITY)
Admission: EM | Admit: 2022-03-30 | Discharge: 2022-03-30 | Disposition: A | Discharge status: Skilled Nursing Facility | Payer: Medicare Other | Attending: Emergency Medicine | Admitting: Emergency Medicine

## 2022-03-30 ENCOUNTER — Emergency Department (HOSPITAL_COMMUNITY): Payer: Medicare Other

## 2022-03-30 ENCOUNTER — Encounter (HOSPITAL_COMMUNITY): Payer: Self-pay

## 2022-03-30 DIAGNOSIS — Z23 Encounter for immunization: Secondary | ICD-10-CM | POA: Insufficient documentation

## 2022-03-30 DIAGNOSIS — I10 Essential (primary) hypertension: Secondary | ICD-10-CM | POA: Insufficient documentation

## 2022-03-30 DIAGNOSIS — W06XXXA Fall from bed, initial encounter: Secondary | ICD-10-CM | POA: Diagnosis not present

## 2022-03-30 DIAGNOSIS — F0280 Dementia in other diseases classified elsewhere without behavioral disturbance: Secondary | ICD-10-CM | POA: Insufficient documentation

## 2022-03-30 DIAGNOSIS — G309 Alzheimer's disease, unspecified: Secondary | ICD-10-CM | POA: Diagnosis not present

## 2022-03-30 DIAGNOSIS — Z7982 Long term (current) use of aspirin: Secondary | ICD-10-CM | POA: Insufficient documentation

## 2022-03-30 DIAGNOSIS — S0181XA Laceration without foreign body of other part of head, initial encounter: Secondary | ICD-10-CM | POA: Insufficient documentation

## 2022-03-30 DIAGNOSIS — S0990XA Unspecified injury of head, initial encounter: Secondary | ICD-10-CM | POA: Diagnosis present

## 2022-03-30 MED ORDER — TETANUS-DIPHTH-ACELL PERTUSSIS 5-2.5-18.5 LF-MCG/0.5 IM SUSY
0.5000 mL | PREFILLED_SYRINGE | Freq: Once | INTRAMUSCULAR | Status: AC
Start: 2022-03-30 — End: 2022-03-30
  Administered 2022-03-30: 0.5 mL via INTRAMUSCULAR
  Filled 2022-03-30: qty 0.5

## 2022-03-30 MED ORDER — LIDOCAINE-EPINEPHRINE 2 %-1:100000 IJ SOLN
20.0000 mL | Freq: Once | INTRAMUSCULAR | Status: AC
  Administered 2022-03-30: 20 mL
  Filled 2022-03-30: qty 1

## 2022-03-30 NOTE — ED Triage Notes (Signed)
Pt from [facility] via [facility] for fall out of bed. Pt states he slipped. Head lac to left forehead and left parietal. Bleeding controlled, no thinners. Denies LOC. Hx alzheimers. A&Ox3. Denies CP, SHOB, vision changes.  144/88 HR 54 95% RA

## 2022-03-30 NOTE — ED Provider Notes (Signed)
[facility]-EMERGENCY DEPT Provider Note   CSN: [id number] Arrival date & time: 03/30/22  0345     History  Chief Complaint  Patient presents with   Head Laceration   Fall    Warren Brooks is a 81 y.o. male.  HPI     This is an 81 year old male who presents following a fall.  Per the patient he slipped and fell.  He states he remembers falling.  However, he cannot give me details surrounding the fall.  He was helped up.  He sustained a laceration to the forehead.  He denies neck pain.  Denies hip or knee pain.  He is not on any anticoagulants.  [place].  He is oriented only to himself.  Level 5 caveat for dementia Chart reviewed.  Patient is DNR.  Home Medications Prior to Admission medications   Medication Sig Start Date End Date Taking? Authorizing Provider  acetaminophen (TYLENOL) 500 MG tablet Take 1,000 mg by mouth every 6 (six) hours as needed for moderate pain or headache.    [provider]  Ascorbic Acid (VITAMIN C) 1000 MG tablet Take 1,000 mg by mouth daily. 02/27/22   [provider]  aspirin 81 MG chewable tablet Chew 1 tablet (81 mg total) by mouth daily. 03/23/22   Rancour, Jeannett Senior, MD  Cholecalciferol (VITAMIN D3 PO) Take 1 capsule by mouth daily.    [provider]  Cholecalciferol (VITAMIN D3) 10 MCG (400 UNIT) tablet Take 400 Units by mouth daily. 02/27/22   [provider]  citalopram (CELEXA) 20 MG tablet Take 20 mg by mouth daily.  06/23/17   [provider]  docusate sodium (COLACE) 100 MG capsule Take 100 mg by mouth 2 (two) times daily.     [provider]  donepezil (ARICEPT) 10 MG tablet Take 10 mg by mouth at bedtime.    [provider]  ferrous sulfate 325 (65 FE) MG tablet Take 325 mg by mouth daily. 02/27/22   [provider]  gabapentin (NEURONTIN) 400 MG capsule Take 400 mg by mouth 2 (two) times daily.  06/21/17   [provider]  Glucosamine-MSM-Hyaluronic Acd (JOINT HEALTH PO) Take 1 tablet by mouth daily.    [provider]  lisinopril (ZESTRIL) 10 MG tablet Take by mouth. 12/12/20   [provider]  lisinopril (ZESTRIL) 2.5 MG tablet Take 2.5 mg by mouth daily. 02/27/22   [provider]  memantine (NAMENDA) 10 MG tablet Take 10 mg by mouth 2 (two) times daily.  06/21/17   [provider]  Multiple Vitamin (DAILY-VITE MULTIVITAMIN) TABS Take 1 tablet by mouth daily. 02/20/22   [provider]  Multiple Vitamins-Minerals (MULTIVITAMIN PO) Take 1 tablet by mouth daily.    [provider]  pravastatin (PRAVACHOL) 20 MG tablet Take 20 mg by mouth daily. 03/03/22   [provider]  QUEtiapine (SEROQUEL) 25 MG tablet Take 0.5 tablets (12.5 mg total) by mouth at bedtime. 12/19/20   Dohmeier, Porfirio Mylar, MD      Allergies    Penicillins    Review of Systems   Review of Systems  Skin:  Positive for wound.  All other systems reviewed and are negative.   Physical Exam Updated Vital Signs BP (!) 159/88   Pulse (!) 55   Temp 98.2 F (36.8 C) (Oral)   Resp 12   SpO2 98%  Physical Exam Vitals and nursing note reviewed.  Constitutional:  Appearance: He is well-developed.  HENT:     Head: Normocephalic.     Comments: 5 to 6 cm laceration left forehead, slightly gaping, no active bleeding    Nose: Nose normal.  Eyes:     Pupils: Pupils are equal, round, and reactive to light.  Cardiovascular:     Rate and Rhythm: Normal rate and regular rhythm.  Pulmonary:     Effort: Pulmonary effort is normal. No respiratory distress.  Abdominal:     Palpations: Abdomen is soft.     Tenderness: There is no abdominal tenderness.  Musculoskeletal:     Cervical back: Neck supple.     Comments: Right arm amputation Normal range of motion bilateral hips and knees, no obvious deformities  Lymphadenopathy:     Cervical: No cervical adenopathy.  Skin:    General: Skin is  warm and dry.  Neurological:     Mental Status: He is alert.     Comments: Oriented only to self  Psychiatric:        Mood and Affect: Mood normal.     ED Results / Procedures / Treatments   Labs (all labs ordered are listed, but only abnormal results are displayed) Labs Reviewed - No data to display  EKG None  Radiology CT Cervical Spine Wo Contrast  Result Date: 03/30/2022 CLINICAL DATA:  81 year old male status post fall from bed. Scalp lacerations. EXAM: CT CERVICAL SPINE WITHOUT CONTRAST TECHNIQUE: Multidetector CT imaging of the cervical spine was performed without intravenous contrast. Multiplanar CT image reconstructions were also generated. RADIATION DOSE REDUCTION: This exam was performed according to the departmental dose-optimization program which includes automated exposure control, adjustment of the mA and/or kV according to patient size and/or use of iterative reconstruction technique. COMPARISON:  Head CT today.  CTA head and neck 03/23/2022. FINDINGS: Alignment: Stable. Straightening of cervical lordosis superimposed on multilevel spondylolisthesis, including fairly pronounced 4-5 mm of anterolisthesis at the cervicothoracic junction. But underlying multilevel cervical spine ankylosis. Stable posterior element alignment. Skull base and vertebrae: Visualized skull base is intact. No atlanto-occipital dissociation. C1 and C2 appear intact and aligned. C3 through C6 interbody and/or posterior element ankylosis. No acute osseous abnormality identified. Soft tissues and spinal canal: No prevertebral fluid or swelling. No visible canal hematoma. Negative noncontrast neck soft tissues except for left carotid calcified atherosclerosis. Disc levels: Advanced cervical spine degeneration in the setting of chronic C3 through C6 ankylosis. Severe C6-C7 disc and endplate degeneration. Pronounced degenerative appearing spondylolisthesis at C7-T1. and facet arthropathy at that level as well as  C2-C3. Mild degenerative spinal stenosis suspected at C6-C7. Upper chest: Mild chronic T3 superior endplate compression fracture and there is multilevel upper thoracic spine ankylosis beginning at T2. Chronic posterior right 3rd through 5th rib fractures. IMPRESSION: 1. No acute traumatic injury identified in the cervical spine. 2. 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 3. Chronic right upper rib fractures and mild T3 compression. Electronically Signed   By: Odessa Fleming M.D.   On: 03/30/2022 05:16   CT Head Wo Contrast  Result Date: 03/30/2022 CLINICAL DATA:  81 year old male status post fall from bed. Scalp lacerations. EXAM: CT HEAD WITHOUT CONTRAST TECHNIQUE: Contiguous axial images were obtained from the base of the skull through the vertex without intravenous contrast. RADIATION DOSE REDUCTION: This exam was performed according to the departmental dose-optimization program which includes automated exposure control, adjustment of the mA and/or kV  according to patient size and/or use of iterative reconstruction technique. COMPARISON:  Recent brain MRI 03/23/2022 and earlier. FINDINGS: Brain: No midline shift, ventriculomegaly, mass effect, evidence of mass lesion, intracranial hemorrhage or evidence of cortically based acute infarction. Disproportionate mesial temporal lobe volume loss (series 5, image 36) redemonstrated. Stable gray-white matter differentiation throughout the brain. Patchy bilateral white matter hypodensity. Vascular: Calcified atherosclerosis at the skull base. No suspicious intracranial vascular hyperdensity. Skull: Stable.  No acute osseous abnormality identified. Sinuses/Orbits: Visualized paranasal sinuses and mastoids are clear. Other: Left forehead and anterior frontal convexity scalp laceration and hematoma (series 4 images 46 through 57) with a small volume of  tracking soft tissue gas. Underlying left frontal bone appears intact. Orbits soft tissues appears stable and negative. IMPRESSION: 1. Left forehead and scalp soft tissue injury without underlying skull fracture. 2. No acute intracranial abnormality. Stable non contrast CT appearance of the brain from earlier this month. Electronically Signed   By: Odessa Fleming M.D.   On: 03/30/2022 05:11    Procedures Procedures    Medications Ordered in ED Medications  lidocaine-EPINEPHrine (XYLOCAINE W/EPI) 2 %-1:100000 (with pres) injection 20 mL (has no administration in time range)  Tdap (BOOSTRIX) injection 0.5 mL (0.5 mLs Intramuscular Given 03/30/22 0457)    ED Course/ Medical Decision Making/ A&P                           Medical Decision Making Amount and/or Complexity of Data Reviewed Radiology: ordered.  Risk Prescription drug management.   This patient presents to the ED for concern of fall, this involves an extensive number of treatment options, and is a complaint that carries with it a high risk of complications and morbidity.  I considered the following differential and admission for this acute, potentially life threatening condition.  The differential diagnosis includes head injury including intracranial bleed, laceration, contusion, fractures, cervical spine injury  MDM:    This is an 81 year old male who presents following a fall.  He is nontoxic-appearing.  Vital signs notable for blood pressure 159/88.  Patient reports that he remembers the fall.  He denies any syncope.  He is only oriented to himself however.  Only evidence of trauma is to the left forehead.  He is DNR.  Laceration repaired by PA.  See additional note for details.  CT head and cervical spine obtained.  No obvious intracranial injury or cervical spine injury.  Do not feel he needs further work-up at this time.  Will discharge back to facility.  Will need suture removal in 5 to 7 days.  (Labs, imaging,  consults)  Labs: I Ordered, and personally interpreted labs.  The pertinent results include: None  Imaging Studies ordered: I ordered imaging studies including CT head cervical spine I independently visualized and interpreted imaging. I agree with the radiologist interpretation  Additional history obtained from chart review.  External records from outside source obtained and reviewed including outside living facility records  Cardiac Monitoring: The patient was maintained on a cardiac monitor.  I personally viewed and interpreted the cardiac monitored which showed an underlying rhythm of: Normal sinus rhythm  Reevaluation: After the interventions noted above, I reevaluated the patient and found that they have :improved  Social Determinants of Health: Dementia  Disposition: Discharge  Co morbidities that complicate the patient evaluation  Past Medical History:  Diagnosis Date   Alzheimer disease (HCC)    Anxiety    Hypertension  Necrotizing fasciitis (HCC)    Osteoarthritis    Pelvic fracture (HCC)      Medicines Meds ordered this encounter  Medications   lidocaine-EPINEPHrine (XYLOCAINE W/EPI) 2 %-1:100000 (with pres) injection 20 mL   Tdap (BOOSTRIX) injection 0.5 mL    I have reviewed the patients home medicines and have made adjustments as needed  Problem List / ED Course: Problem List Items Addressed This Visit   None Visit Diagnoses     Facial laceration, initial encounter    -  Primary                   Final Clinical Impression(s) / ED Diagnoses Final diagnoses:  Facial laceration, initial encounter    Rx / DC Orders ED Discharge Orders     None         Shon Baton, MD 03/30/22 [phone]

## 2022-03-30 NOTE — ED Provider Notes (Signed)
..  Laceration Repair  Date/Time: 03/30/2022 5:32 AM  Performed by: Arthor Captain, PA-C Authorized by: Arthor Captain, PA-C   Consent:    Consent obtained:  Verbal   Consent given by:  Patient   Risks discussed:  Infection, need for additional repair, pain, poor cosmetic result and poor wound healing   Alternatives discussed:  No treatment and delayed treatment Universal protocol:    Procedure explained and questions answered to patient or proxy's satisfaction: yes     Relevant documents present and verified: yes     Test results available: yes     Imaging studies available: yes     Required blood products, implants, devices, and special equipment available: yes     Site/side marked: yes     Immediately prior to procedure, a time out was called: yes     Patient identity confirmed:  Verbally with patient Anesthesia:    Anesthesia method:  Local infiltration   Local anesthetic:  Lidocaine 2% WITH epi Laceration details:    Location:  Face   Face location:  Forehead   Length (cm):  5   Depth (mm):  10 Pre-procedure details:    Preparation:  Patient was prepped and draped in usual sterile fashion Exploration:    Hemostasis achieved with:  Epinephrine   Wound extent: no areolar tissue violation noted, no fascia violation noted, no foreign bodies/material noted, no muscle damage noted, no nerve damage noted, no tendon damage noted, no underlying fracture noted and no vascular damage noted   Treatment:    Area cleansed with:  Povidone-iodine   Amount of cleaning:  Standard   Irrigation solution:  Sterile saline   Irrigation method:  Syringe Skin repair:    Repair method:  Sutures   Suture size:  4-0   Suture material:  Prolene   Suture technique:  Simple interrupted   Number of sutures:  7 Approximation:    Approximation:  Close Repair type:    Repair type:  Simple Post-procedure details:    Dressing:  Non-adherent dressing   Procedure completion:  Tolerated well, no  immediate complications     Arthor Captain, PA-C 03/30/22 0554    Wilkie Aye, Mayer Masker, MD 03/31/22 [phone]

## 2022-03-30 NOTE — Discharge Instructions (Signed)
You were seen today for head injury.  You sustained a laceration to the forehead.  Sutures need to be removed in 5 to 7 days.  Apply antibiotic ointment and keep covered.

## 2022-05-14 ENCOUNTER — Emergency Department (HOSPITAL_COMMUNITY)
Admission: EM | Admit: 2022-05-14 | Discharge: 2022-05-14 | Disposition: A | Discharge status: Skilled Nursing Facility | Payer: Medicare Other | Attending: Emergency Medicine | Admitting: Emergency Medicine

## 2022-05-14 ENCOUNTER — Emergency Department (HOSPITAL_COMMUNITY): Payer: Medicare Other

## 2022-05-14 ENCOUNTER — Encounter (HOSPITAL_COMMUNITY): Payer: Self-pay | Admitting: Emergency Medicine

## 2022-05-14 ENCOUNTER — Other Ambulatory Visit: Payer: Self-pay

## 2022-05-14 DIAGNOSIS — I10 Essential (primary) hypertension: Secondary | ICD-10-CM | POA: Insufficient documentation

## 2022-05-14 DIAGNOSIS — S0181XA Laceration without foreign body of other part of head, initial encounter: Secondary | ICD-10-CM | POA: Insufficient documentation

## 2022-05-14 DIAGNOSIS — W182XXA Fall in (into) shower or empty bathtub, initial encounter: Secondary | ICD-10-CM | POA: Insufficient documentation

## 2022-05-14 DIAGNOSIS — S022XXA Fracture of nasal bones, initial encounter for closed fracture: Secondary | ICD-10-CM | POA: Diagnosis not present

## 2022-05-14 DIAGNOSIS — Y92129 Unspecified place in nursing home as the place of occurrence of the external cause: Secondary | ICD-10-CM | POA: Diagnosis not present

## 2022-05-14 DIAGNOSIS — F0390 Unspecified dementia without behavioral disturbance: Secondary | ICD-10-CM | POA: Insufficient documentation

## 2022-05-14 DIAGNOSIS — S0992XA Unspecified injury of nose, initial encounter: Secondary | ICD-10-CM | POA: Diagnosis present

## 2022-05-14 DIAGNOSIS — Z23 Encounter for immunization: Secondary | ICD-10-CM | POA: Diagnosis not present

## 2022-05-14 DIAGNOSIS — S0990XA Unspecified injury of head, initial encounter: Secondary | ICD-10-CM

## 2022-05-14 DIAGNOSIS — Z79899 Other long term (current) drug therapy: Secondary | ICD-10-CM | POA: Insufficient documentation

## 2022-05-14 DIAGNOSIS — Z7982 Long term (current) use of aspirin: Secondary | ICD-10-CM | POA: Insufficient documentation

## 2022-05-14 LAB — COMPREHENSIVE METABOLIC PANEL
ALT: 18 U/L (ref 0–44)
AST: 22 U/L (ref 15–41)
Albumin: 3.9 g/dL (ref 3.5–5.0)
Alkaline Phosphatase: 78 U/L (ref 38–126)
Anion gap: 9 (ref 5–15)
BUN: 14 mg/dL (ref 8–23)
CO2: 23 mmol/L (ref 22–32)
Calcium: 9.3 mg/dL (ref 8.9–10.3)
Chloride: 106 mmol/L (ref 98–111)
Creatinine, Ser: 0.86 mg/dL (ref 0.61–1.24)
GFR, Estimated: >60 mL/min (ref >60)
Glucose, Bld: 92 mg/dL (ref 70–99)
Potassium: 4.1 mmol/L (ref 3.5–5.1)
Sodium: 138 mmol/L (ref 135–145)
Total Bilirubin: 0.8 mg/dL (ref 0.3–1.2)
Total Protein: 6.4 g/dL — ABNORMAL LOW (ref 6.5–8.1)

## 2022-05-14 LAB — CBC
HCT: 39.2 % (ref 39.0–52.0)
Hemoglobin: 13.5 g/dL (ref 13.0–17.0)
MCH: 33 pg (ref 26.0–34.0)
MCHC: 34.4 g/dL (ref 30.0–36.0)
MCV: 95.8 fL (ref 80.0–100.0)
Platelets: 192 10*3/uL (ref 150–400)
RBC: 4.09 MIL/uL — ABNORMAL LOW (ref 4.22–5.81)
RDW: 12.6 % (ref 11.5–15.5)
WBC: 5.2 10*3/uL (ref 4.0–10.5)
nRBC: 0 % (ref 0.0–0.2)

## 2022-05-14 MED ORDER — TETANUS-DIPHTH-ACELL PERTUSSIS 5-2.5-18.5 LF-MCG/0.5 IM SUSY
0.5000 mL | PREFILLED_SYRINGE | Freq: Once | INTRAMUSCULAR | Status: AC
Start: 2022-05-14 — End: 2022-05-14
  Administered 2022-05-14: 0.5 mL via INTRAMUSCULAR
  Filled 2022-05-14: qty 0.5

## 2022-05-14 MED ORDER — ACETAMINOPHEN 325 MG PO TABS
650.0000 mg | ORAL_TABLET | Freq: Once | ORAL | Status: AC
  Administered 2022-05-14: 650 mg via ORAL
  Filled 2022-05-14: qty 2

## 2022-05-14 NOTE — ED Notes (Addendum)
Patient's daughter updated by this RN on his condition /plan of care.

## 2022-05-14 NOTE — ED Triage Notes (Signed)
Patient from [facility], had a fall from standing height in the bathroom and hit his head on the floor.  He slipped on wet floor or tripped.  He remembers getting up from the toilet and did not pass out.  Patient has a laceration above the right eye.  No blood thinners.  EMS states that his nose is deformed.  EMS also states that there was a lot of blood for the laceration.  EBL of per EMS.  Patient does have a headache.

## 2022-05-14 NOTE — ED Provider Notes (Signed)
[facility] EMERGENCY DEPARTMENT Provider Note   CSN: [id number] Arrival date & time: 05/14/22  0130     History  Chief Complaint  Patient presents with   Fall   Laceration   Level 5 caveat due to dementia Warren Brooks is a 81 y.o. male.  The history is provided by the patient.  Fall This is a new problem. Associated symptoms include headaches. Nothing relieves the symptoms.  Laceration Associated symptoms: no fever    Patient with history of dementia and hypertension presents from nursing home for fall.  Patient was standing in the bathroom when he fell.  He slipped on the floor and fell hitting his head.  Unknown LOC.  Patient has a laceration on his forehead that has been actively bleeding.  He is not on anticoagulation.  EMS reports the patient has lost quite a bit of blood   Past Medical History:  Diagnosis Date   Alzheimer disease (HCC)    Anxiety    Hypertension    Necrotizing fasciitis (HCC)    Osteoarthritis    Pelvic fracture (HCC)     Home Medications Prior to Admission medications   Medication Sig Start Date End Date Taking? Authorizing Provider  acetaminophen (TYLENOL) 500 MG tablet Take 1,000 mg by mouth every 6 (six) hours as needed for moderate pain or headache.    [provider]  Ascorbic Acid (VITAMIN C) 1000 MG tablet Take 1,000 mg by mouth daily. 02/27/22   [provider]  aspirin 81 MG chewable tablet Chew 1 tablet (81 mg total) by mouth daily. 03/23/22   Rancour, Jeannett Senior, MD  Cholecalciferol (VITAMIN D3 PO) Take 1 capsule by mouth daily.    [provider]  Cholecalciferol (VITAMIN D3) 10 MCG (400 UNIT) tablet Take 400 Units by mouth daily. 02/27/22   [provider]  citalopram (CELEXA) 20 MG tablet Take 20 mg by mouth daily.  06/23/17   [provider]  docusate sodium (COLACE) 100 MG capsule Take 100 mg by mouth 2 (two) times daily.     [provider]  donepezil (ARICEPT) 10 MG  tablet Take 10 mg by mouth at bedtime.    [provider]  ferrous sulfate 325 (65 FE) MG tablet Take 325 mg by mouth daily. 02/27/22   [provider]  gabapentin (NEURONTIN) 400 MG capsule Take 400 mg by mouth 2 (two) times daily.  06/21/17   [provider]  Glucosamine-MSM-Hyaluronic Acd (JOINT HEALTH PO) Take 1 tablet by mouth daily.    [provider]  lisinopril (ZESTRIL) 10 MG tablet Take by mouth. 12/12/20   [provider]  lisinopril (ZESTRIL) 2.5 MG tablet Take 2.5 mg by mouth daily. 02/27/22   [provider]  memantine (NAMENDA) 10 MG tablet Take 10 mg by mouth 2 (two) times daily.  06/21/17   [provider]  Multiple Vitamin (DAILY-VITE MULTIVITAMIN) TABS Take 1 tablet by mouth daily. 02/20/22   [provider]  Multiple Vitamins-Minerals (MULTIVITAMIN PO) Take 1 tablet by mouth daily.    [provider]  pravastatin (PRAVACHOL) 20 MG tablet Take 20 mg by mouth daily. 03/03/22   [provider]  QUEtiapine (SEROQUEL) 25 MG tablet Take 0.5 tablets (12.5 mg total) by mouth at bedtime. 12/19/20   Dohmeier, Porfirio Mylar, MD      Allergies    Penicillins    Review of Systems   Review of Systems  Unable to perform ROS: Dementia  Constitutional:  Negative  for fever.  Skin:  Positive for wound.  Neurological:  Positive for headaches.    Physical Exam Updated Vital Signs BP (!) 145/101   Pulse 63   Temp 97.9 F (36.6 C) (Oral)   Resp 14   SpO2 100%  Physical Exam CONSTITUTIONAL: Elderly and uncomfortable. HEAD: 3 cm laceration to his forehead that is actively bleeding EYES: EOMI/PERRL ENMT: ?  Nasal deformity but no evidence of epistaxis or septal hematoma.  No malocclusion or obvious dental injury SPINE/BACK:entire spine nontender CV: S1/S2 noted, no murmurs/rubs/gallops noted LUNGS: Lungs are clear to auscultation bilaterally, no apparent distress Chest-no bruising or tenderness ABDOMEN: soft,  nontender NEURO: Pt is awake/alert moves all extremitiesx4.  No facial droop.   EXTREMITIES: pulses normal/equal, full ROM, pelvis stable, all other extremities/joints palpated/ranged and nontender SKIN: warm, color normal  ED Results / Procedures / Treatments   Labs (all labs ordered are listed, but only abnormal results are displayed) Labs Reviewed  COMPREHENSIVE METABOLIC PANEL - Abnormal; Notable for the following components:      Result Value   Total Protein 6.4 (*)    All other components within normal limits  CBC - Abnormal; Notable for the following components:   RBC 4.09 (*)    All other components within normal limits    EKG EKG Interpretation  Date/Time:  Monday May 14 2022 01:48:18 EDT Ventricular Rate:  52 PR Interval:  197 QRS Duration: 102 QT Interval:  443 QTC Calculation: 412 R Axis:   -4 Text Interpretation: Sinus rhythm Confirmed by Ripley Fraise [phone]) on 05/14/2022 2:10:02 AM Prehospital EKG was reviewed which reveals sinus rhythm without acute ST changes  Radiology CT CERVICAL SPINE WO CONTRAST  Result Date: 05/14/2022 CLINICAL DATA:  Trauma. EXAM: CT HEAD WITHOUT CONTRAST CT MAXILLOFACIAL WITHOUT CONTRAST CT CERVICAL SPINE WITHOUT CONTRAST TECHNIQUE: Multidetector CT imaging of the head, cervical spine, and maxillofacial structures were performed using the standard protocol without intravenous contrast. Multiplanar CT image reconstructions of the cervical spine and maxillofacial structures were also generated. RADIATION DOSE REDUCTION: This exam was performed according to the departmental dose-optimization program which includes automated exposure control, adjustment of the mA and/or kV according to patient size and/or use of iterative reconstruction technique. COMPARISON:  Head CT dated 03/30/2022. FINDINGS: CT HEAD FINDINGS Brain: Moderate age-related atrophy and chronic microvascular ischemic changes. There is no acute intracranial hemorrhage. No mass  effect or midline shift. No extra-axial fluid collection. Vascular: No hyperdense vessel or unexpected calcification. Skull: Normal. Negative for fracture or focal lesion. Other: Laceration of the forehead. CT MAXILLOFACIAL FINDINGS Osseous: Nondisplaced fracture of the tip of the nasal bone. No other acute fracture. No mandibular subluxation. Orbits: The globes and retro-orbital fat are preserved. Sinuses: Clear. Soft tissues: Laceration of the skin over the forehead. CT CERVICAL SPINE FINDINGS Alignment: No acute subluxation.  Grade 1 C7-T1 anterolisthesis. Skull base and vertebrae: No acute fracture.  Osteopenia. Soft tissues and spinal canal: No prevertebral fluid or swelling. No visible canal hematoma. Disc levels:  No acute findings.  Degenerative changes. Upper chest: Negative. Other: Bilateral carotid bulb calcified plaques. IMPRESSION: 1. No acute intracranial pathology. 2. No acute/traumatic cervical spine pathology. 3. Nondisplaced fracture of the tip of the nasal bone. Electronically Signed   By: Anner Crete M.D.   On: 05/14/2022 03:20   CT HEAD WO CONTRAST  Result Date: 05/14/2022 CLINICAL DATA:  Trauma. EXAM: CT HEAD WITHOUT CONTRAST CT MAXILLOFACIAL WITHOUT CONTRAST CT CERVICAL SPINE WITHOUT CONTRAST TECHNIQUE: Multidetector CT imaging of  the head, cervical spine, and maxillofacial structures were performed using the standard protocol without intravenous contrast. Multiplanar CT image reconstructions of the cervical spine and maxillofacial structures were also generated. RADIATION DOSE REDUCTION: This exam was performed according to the departmental dose-optimization program which includes automated exposure control, adjustment of the mA and/or kV according to patient size and/or use of iterative reconstruction technique. COMPARISON:  Head CT dated 03/30/2022. FINDINGS: CT HEAD FINDINGS Brain: Moderate age-related atrophy and chronic microvascular ischemic changes. There is no acute  intracranial hemorrhage. No mass effect or midline shift. No extra-axial fluid collection. Vascular: No hyperdense vessel or unexpected calcification. Skull: Normal. Negative for fracture or focal lesion. Other: Laceration of the forehead. CT MAXILLOFACIAL FINDINGS Osseous: Nondisplaced fracture of the tip of the nasal bone. No other acute fracture. No mandibular subluxation. Orbits: The globes and retro-orbital fat are preserved. Sinuses: Clear. Soft tissues: Laceration of the skin over the forehead. CT CERVICAL SPINE FINDINGS Alignment: No acute subluxation.  Grade 1 C7-T1 anterolisthesis. Skull base and vertebrae: No acute fracture.  Osteopenia. Soft tissues and spinal canal: No prevertebral fluid or swelling. No visible canal hematoma. Disc levels:  No acute findings.  Degenerative changes. Upper chest: Negative. Other: Bilateral carotid bulb calcified plaques. IMPRESSION: 1. No acute intracranial pathology. 2. No acute/traumatic cervical spine pathology. 3. Nondisplaced fracture of the tip of the nasal bone. Electronically Signed   By: Elgie Collard M.D.   On: 05/14/2022 03:20   CT MAXILLOFACIAL WO CONTRAST  Result Date: 05/14/2022 CLINICAL DATA:  Trauma. EXAM: CT HEAD WITHOUT CONTRAST CT MAXILLOFACIAL WITHOUT CONTRAST CT CERVICAL SPINE WITHOUT CONTRAST TECHNIQUE: Multidetector CT imaging of the head, cervical spine, and maxillofacial structures were performed using the standard protocol without intravenous contrast. Multiplanar CT image reconstructions of the cervical spine and maxillofacial structures were also generated. RADIATION DOSE REDUCTION: This exam was performed according to the departmental dose-optimization program which includes automated exposure control, adjustment of the mA and/or kV according to patient size and/or use of iterative reconstruction technique. COMPARISON:  Head CT dated 03/30/2022. FINDINGS: CT HEAD FINDINGS Brain: Moderate age-related atrophy and chronic microvascular  ischemic changes. There is no acute intracranial hemorrhage. No mass effect or midline shift. No extra-axial fluid collection. Vascular: No hyperdense vessel or unexpected calcification. Skull: Normal. Negative for fracture or focal lesion. Other: Laceration of the forehead. CT MAXILLOFACIAL FINDINGS Osseous: Nondisplaced fracture of the tip of the nasal bone. No other acute fracture. No mandibular subluxation. Orbits: The globes and retro-orbital fat are preserved. Sinuses: Clear. Soft tissues: Laceration of the skin over the forehead. CT CERVICAL SPINE FINDINGS Alignment: No acute subluxation.  Grade 1 C7-T1 anterolisthesis. Skull base and vertebrae: No acute fracture.  Osteopenia. Soft tissues and spinal canal: No prevertebral fluid or swelling. No visible canal hematoma. Disc levels:  No acute findings.  Degenerative changes. Upper chest: Negative. Other: Bilateral carotid bulb calcified plaques. IMPRESSION: 1. No acute intracranial pathology. 2. No acute/traumatic cervical spine pathology. 3. Nondisplaced fracture of the tip of the nasal bone. Electronically Signed   By: Elgie Collard M.D.   On: 05/14/2022 03:20    Procedures .Marland KitchenLaceration Repair  Date/Time: 05/14/2022 2:30 AM  Performed by: Zadie Rhine, MD Authorized by: Zadie Rhine, MD   Consent:    Consent obtained:  Emergent situation   Consent given by:  Patient Universal protocol:    Patient identity confirmed:  Provided demographic data Anesthesia:    Anesthesia method:  Local infiltration   Local anesthetic:  Lidocaine 2% WITH  epi Laceration details:    Location: forehead.   Length (cm):  3 Exploration:    Contaminated: no   Skin repair:    Repair method:  Sutures   Suture size:  4-0   Suture material:  Prolene   Suture technique:  Simple interrupted   Number of sutures:  3 Approximation:    Approximation:  Close Repair type:    Repair type:  Simple Post-procedure details:    Procedure completion:  Tolerated  well, no immediate complications Comments:     Patient with active bleeding from his wound that would not respond to pressure.  Therefore I emergently placed 3 sutures and obtain hemostasis.     Medications Ordered in ED Medications  Tdap (BOOSTRIX) injection 0.5 mL (0.5 mLs Intramuscular Given 05/14/22 0227)    ED Course/ Medical Decision Making/ A&P Clinical Course as of 05/14/22 0356  Mon May 14, 2022  0355 CT imaging reveals small nasal fracture.  No head or C-spine injury.  Patient is able to bear weight in the emergency department.  He will be discharged home.  No further bleeding from wound [DW]    Clinical Course User Index [DW] Ripley Fraise, MD                           Medical Decision Making Amount and/or Complexity of Data Reviewed Labs: ordered. Radiology: ordered.  Risk Prescription drug management.   Patient presents after likely mechanical fall.  He had laceration to forehead that was repaired.  Other than nasal fracture, no other acute injuries.  He will be discharged back to facility        Final Clinical Impression(s) / ED Diagnoses Final diagnoses:  Injury of head, initial encounter  Laceration of forehead, initial encounter  Closed fracture of nasal bone, initial encounter    Rx / DC Orders ED Discharge Orders     None         Ripley Fraise, MD 05/14/22 [phone]

## 2022-05-14 NOTE — ED Notes (Signed)
Patient ambulated with unsteady gait and maximum assistance by RN , assisted back to bed.

## 2022-05-14 NOTE — ED Notes (Signed)
Patient transported to CT scan . 

## 2022-05-14 NOTE — ED Notes (Signed)
Report given to [facility] , PTAR notified by Diplomatic Services operational officer .

## 2022-07-27 ENCOUNTER — Telehealth: Payer: Self-pay | Admitting: Adult Health

## 2022-07-27 NOTE — Telephone Encounter (Signed)
Pt daughter Nira Conn) is Calling. Said she wants to talk to [facility] about Pt taking medication . QUEtiapine (SEROQUEL) 25 MG tablet two times a day. Nira Conn is requesting a call back from the nurse.

## 2022-07-30 NOTE — Telephone Encounter (Signed)
I returned Heather's call.  She states the nurses at [facility] are suggesting the patient take the Seroquel twice a day instead of just at bedtime to help with his agitation.  Patient was last seen by our office in March 2023.  Refills were deferred to primary care.  He has been seeing Dr. Coletta Memos.  I let her know I would address the request with [facility] NP. She is aware Jinny Blossom NP is out of the office today.

## 2022-08-01 NOTE — Telephone Encounter (Signed)
I had in my note that he was taking Seroquel 12.5 mg at bedtime.  Increasing this to twice a day is probably okay however have not seen him since March.  At that time I discharged him back to his PCP.  If he has been seeing his PCP this might be a better question for them.

## 2022-08-01 NOTE — Telephone Encounter (Signed)
Called Heather back and LVM (ok per DPR) advising that [facility] NP reviewed the message. When she last saw patient in March he was on seroquel 12.5 qhs and she returned him back to PCP. Increasing to BID dosing will probably be ok but Jinny Blossom has advised that if patient is seeing his primary care more recently this question might be better for him. I advised to check with Dr Coletta Memos as he may feel comfortable increasing the dose, but if there are any issues we may have to schedule an appt here first. Left office number for call back if needed.

## 2022-08-20 ENCOUNTER — Emergency Department (HOSPITAL_COMMUNITY)
Admission: EM | Admit: 2022-08-20 | Discharge: 2022-08-20 | Payer: Medicare Other | Attending: Emergency Medicine | Admitting: Emergency Medicine

## 2022-08-20 ENCOUNTER — Emergency Department (HOSPITAL_COMMUNITY): Payer: Medicare Other

## 2022-08-20 ENCOUNTER — Other Ambulatory Visit: Payer: Self-pay

## 2022-08-20 ENCOUNTER — Encounter (HOSPITAL_COMMUNITY): Payer: Self-pay | Admitting: Emergency Medicine

## 2022-08-20 DIAGNOSIS — R404 Transient alteration of awareness: Secondary | ICD-10-CM | POA: Insufficient documentation

## 2022-08-20 DIAGNOSIS — Z7982 Long term (current) use of aspirin: Secondary | ICD-10-CM | POA: Insufficient documentation

## 2022-08-20 DIAGNOSIS — I10 Essential (primary) hypertension: Secondary | ICD-10-CM | POA: Insufficient documentation

## 2022-08-20 DIAGNOSIS — Z79899 Other long term (current) drug therapy: Secondary | ICD-10-CM | POA: Insufficient documentation

## 2022-08-20 DIAGNOSIS — E876 Hypokalemia: Secondary | ICD-10-CM | POA: Insufficient documentation

## 2022-08-20 DIAGNOSIS — G3183 Dementia with Lewy bodies: Secondary | ICD-10-CM | POA: Insufficient documentation

## 2022-08-20 DIAGNOSIS — R4182 Altered mental status, unspecified: Secondary | ICD-10-CM | POA: Diagnosis present

## 2022-08-20 LAB — COMPREHENSIVE METABOLIC PANEL
ALT: 16 U/L (ref 0–44)
AST: 18 U/L (ref 15–41)
Albumin: 3 g/dL — ABNORMAL LOW (ref 3.5–5.0)
Alkaline Phosphatase: 62 U/L (ref 38–126)
Anion gap: 9 (ref 5–15)
BUN: 15 mg/dL (ref 8–23)
CO2: 18 mmol/L — ABNORMAL LOW (ref 22–32)
Calcium: 7.6 mg/dL — ABNORMAL LOW (ref 8.9–10.3)
Chloride: 114 mmol/L — ABNORMAL HIGH (ref 98–111)
Creatinine, Ser: 0.91 mg/dL (ref 0.61–1.24)
GFR, Estimated: >60 mL/min (ref >60)
Glucose, Bld: 88 mg/dL (ref 70–99)
Potassium: 3.4 mmol/L — ABNORMAL LOW (ref 3.5–5.1)
Sodium: 141 mmol/L (ref 135–145)
Total Bilirubin: 0.5 mg/dL (ref 0.3–1.2)
Total Protein: 5.1 g/dL — ABNORMAL LOW (ref 6.5–8.1)

## 2022-08-20 LAB — CBC WITH DIFFERENTIAL/PLATELET
Abs Immature Granulocytes: 0.04 10*3/uL (ref 0.00–0.07)
Basophils Absolute: 0 10*3/uL (ref 0.0–0.1)
Basophils Relative: 1 %
Eosinophils Absolute: 0.2 10*3/uL (ref 0.0–0.5)
Eosinophils Relative: 3 %
HCT: 35.3 % — ABNORMAL LOW (ref 39.0–52.0)
Hemoglobin: 11.5 g/dL — ABNORMAL LOW (ref 13.0–17.0)
Immature Granulocytes: 1 %
Lymphocytes Relative: 20 %
Lymphs Abs: 1 10*3/uL (ref 0.7–4.0)
MCH: 32.7 pg (ref 26.0–34.0)
MCHC: 32.6 g/dL (ref 30.0–36.0)
MCV: 100.3 fL — ABNORMAL HIGH (ref 80.0–100.0)
Monocytes Absolute: 0.6 10*3/uL (ref 0.1–1.0)
Monocytes Relative: 11 %
Neutro Abs: 3.4 10*3/uL (ref 1.7–7.7)
Neutrophils Relative %: 64 %
Platelets: 195 10*3/uL (ref 150–400)
RBC: 3.52 MIL/uL — ABNORMAL LOW (ref 4.22–5.81)
RDW: 12.5 % (ref 11.5–15.5)
WBC: 5.2 10*3/uL (ref 4.0–10.5)
nRBC: 0 % (ref 0.0–0.2)

## 2022-08-20 LAB — RAPID URINE DRUG SCREEN, HOSP PERFORMED
Amphetamines: NOT DETECTED
Barbiturates: NOT DETECTED
Benzodiazepines: NOT DETECTED
Cocaine: NOT DETECTED
Opiates: NOT DETECTED
Tetrahydrocannabinol: NOT DETECTED

## 2022-08-20 LAB — URINALYSIS, ROUTINE W REFLEX MICROSCOPIC
Bilirubin Urine: NEGATIVE
Glucose, UA: NEGATIVE mg/dL
Hgb urine dipstick: NEGATIVE
Ketones, ur: NEGATIVE mg/dL
Leukocytes,Ua: NEGATIVE
Nitrite: NEGATIVE
Protein, ur: NEGATIVE mg/dL
Specific Gravity, Urine: 1.023 (ref 1.005–1.030)
pH: 5 (ref 5.0–8.0)

## 2022-08-20 LAB — AMMONIA: Ammonia: 19 umol/L (ref 9–35)

## 2022-08-20 LAB — TSH: TSH: 1.77 u[IU]/mL (ref 0.350–4.500)

## 2022-08-20 LAB — T4, FREE: Free T4: 0.85 ng/dL (ref 0.61–1.12)

## 2022-08-20 NOTE — ED Notes (Signed)
Daughter at bedside for discharge. SNF has been notified. Pt up to wheelchair. D/c paperwork given to daughter to give to SNF along w/DNR paperwork.  Daughter denies questions at this time.

## 2022-08-20 NOTE — Discharge Instructions (Addendum)
You are seen in the emergency department for sleepiness/unresponsiveness.  Your work-up here was reassuring, showing no emergency cause of your symptoms today.  Please discuss with your SNF needing to get a repeat lab in 2-3 days to recheck hemoglobin and calcium, as these were slightly low today.  Please also make an appointment to see your neurologist as soon as possible to discuss the need for outpatient EEG.  Please call their office tomorrow to discuss this.  Please return to the emergency department if you have more episodes and do not return to baseline after, have seizure like activity, develop fevers, or if you have any other reason to think you need emergency care.  We hope you feel better soon.

## 2022-08-20 NOTE — ED Triage Notes (Signed)
Per [facility] pt coming from [facility] at [facility]. Patient went on an outing with nursing facility, walked into the facility, sat down and then went unresponsive. LKN 14:15. Per ems patient would only respond to pain en route. Patient opening eyes and responding to voice on arrival.

## 2022-08-20 NOTE — ED Provider Notes (Signed)
[facility] EMERGENCY DEPARTMENT Provider Note   CSN: [id number] Arrival date & time: 08/20/22  1520     History  Chief Complaint  Patient presents with   Altered Mental Status     Kaream Kisiel is an 81 year old male with history of Lewy Body dementia, HTN who presents to the emergency department for concerns for unresponsiveness.  Per EMS, he went on an outing with his nursing facility earlier today, and then upon arriving back there, walked into the facility, sat down, and then "went unresponsive."  They initially thought he was sleeping, but then they were unable to arouse him, so they called 911.  Per EMS, upon arrival he was GCS 3 and not responsive to sternal rub.  He did respond to pain in route with them but nothing else.  Vital signs were reassuring and blood glucose was normal.  Upon rolling into the emergency department, the patient suddenly awakened and began talking without any slurred speech, decreased level of consciousness, or confusion.  He reports that he does not remember exactly what happened, but does remember bouncing around in the ambulance.  He denies any known history of seizures or CVA.  Per EMS, the patient's daughter reported that something similar had happened in the past.  The patient does not remember this.  He reports pain in his neck and some soreness all over his body, otherwise feels his normal self.  The history is provided by the patient, the EMS personnel and a relative.  Altered Mental Status      Home Medications Prior to Admission medications   Medication Sig Start Date End Date Taking? Authorizing Provider  acetaminophen (TYLENOL) 500 MG tablet Take 1,000 mg by mouth every 6 (six) hours as needed for moderate pain or headache.    [provider]  Ascorbic Acid (VITAMIN C) 1000 MG tablet Take 1,000 mg by mouth daily. 02/27/22   [provider]  aspirin 81 MG chewable tablet Chew 1 tablet (81 mg total) by mouth  daily. 03/23/22   Rancour, Annie Main, MD  Cholecalciferol (VITAMIN D3 PO) Take 1 capsule by mouth daily.    [provider]  Cholecalciferol (VITAMIN D3) 10 MCG (400 UNIT) tablet Take 400 Units by mouth daily. 02/27/22   [provider]  citalopram (CELEXA) 20 MG tablet Take 20 mg by mouth daily.  06/23/17   [provider]  docusate sodium (COLACE) 100 MG capsule Take 100 mg by mouth 2 (two) times daily.     [provider]  donepezil (ARICEPT) 10 MG tablet Take 10 mg by mouth at bedtime.    [provider]  ferrous sulfate 325 (65 FE) MG tablet Take 325 mg by mouth daily. 02/27/22   [provider]  gabapentin (NEURONTIN) 400 MG capsule Take 400 mg by mouth 2 (two) times daily.  06/21/17   [provider]  Glucosamine-MSM-Hyaluronic Acd (JOINT HEALTH PO) Take 1 tablet by mouth daily.    [provider]  lisinopril (ZESTRIL) 10 MG tablet Take by mouth. 12/12/20   [provider]  lisinopril (ZESTRIL) 2.5 MG tablet Take 2.5 mg by mouth daily. 02/27/22   [provider]  memantine (NAMENDA) 10 MG tablet Take 10 mg by mouth 2 (two) times daily.  06/21/17   [provider]  Multiple Vitamin (DAILY-VITE MULTIVITAMIN) TABS Take 1 tablet by mouth daily. 02/20/22   [provider]  Multiple Vitamins-Minerals (MULTIVITAMIN PO) Take 1 tablet by mouth daily.  [provider]  pravastatin (PRAVACHOL) 20 MG tablet Take 20 mg by mouth daily. 03/03/22   [provider]  QUEtiapine (SEROQUEL) 25 MG tablet Take 0.5 tablets (12.5 mg total) by mouth at bedtime. 12/19/20   Dohmeier, Asencion Partridge, MD      Allergies    Penicillins    Review of Systems   Review of Systems  See HPI.   Physical Exam Updated Vital Signs BP (!) 131/98   Pulse 61   Resp 18   Ht 5\' 3"  (1.6 m)   Wt 65.3 kg   SpO2 100%   BMI 25.51 kg/m   Physical Exam Vitals and nursing note reviewed.  Constitutional:      General: He is  not in acute distress.    Appearance: He is not toxic-appearing.  HENT:     Head: Normocephalic and atraumatic.     Mouth/Throat:     Mouth: Mucous membranes are moist.     Pharynx: Oropharynx is clear.  Eyes:     Extraocular Movements: Extraocular movements intact.     Pupils: Pupils are equal, round, and reactive to light.  Cardiovascular:     Rate and Rhythm: Normal rate.     Pulses: Normal pulses.     Heart sounds: Normal heart sounds.  Pulmonary:     Effort: Pulmonary effort is normal.     Breath sounds: Normal breath sounds.  Abdominal:     General: There is no distension.     Palpations: Abdomen is soft.     Tenderness: There is no abdominal tenderness.  Musculoskeletal:     Right lower leg: No edema.     Left lower leg: No edema.     Comments: Remote full right arm amputation.  Skin:    General: Skin is warm and dry.  Neurological:     General: No focal deficit present.     Mental Status: He is alert.    Mental Status Exam:  Orientation: Alert and oriented to person, at baseline per daughter at bedside given history of dementia.  Memory: Cooperative, follows commands well with slight delay.   Attention, concentration: Attention span and concentration are normal.  Language: Speech is clear and language is normal.   Cranial Nerves:   CN 2 (Optic): Visual fields intact to confrontation. CN 3,4,6 (EOM): Pupils 67mm, equal and reactive to light. Full extraocular eye movement without nystagmus.  CN 5 (Trigeminal): Facial sensation is normal bilaterally.  CN 7 (Facial): No facial weakness or asymmetry.  CN 8 (Auditory): Auditory acuity grossly normal.  CN 9,10 (Glossophar): The uvula is midline, the palate elevates symmetrically.  CN 11 (spinal access): Normal sternocleidomastoid and trapezius strength.  CN 12 (Hypoglossal): The tongue is midline. No atrophy or fasciculations.  Motor:  Strength: 4+/5 on flexion and extension of LUE. No pronation or drift.  5/5 and  symmetric in lower extremities. Tone: Tone and muscle bulk are normal in the upper and lower extremities for age.  Coordination: Slow but intact finger-to-nose.  Sensation: Intact to light touch throughout.    ED Results / Procedures / Treatments   Labs (all labs ordered are listed, but only abnormal results are displayed) Labs Reviewed - No data to display  EKG None  Radiology No results found.  Procedures Procedures    Medications Ordered in ED Medications - No data to display  ED Course/ Medical Decision Making/ A&P  Medical Decision Making Problems Addressed: Transient alteration of awareness: acute illness or injury that poses a threat to life or bodily functions  Amount and/or Complexity of Data Reviewed Independent Historian: caregiver and EMS External Data Reviewed: labs, radiology and notes. Labs: ordered. Decision-making details documented in ED Course. Radiology: ordered and independent interpretation performed. Decision-making details documented in ED Course.   Ruairi Brasuell is an 81 year old male with history of Lewy Body dementia, HTN who presents to the emergency department for concerns for unresponsiveness/AMS.   Initial concern highest for possible seizure, CVA, or deep sleep. Pupils noted to be 2 mm, concern for possible opiate use, however would not have expected patient to spontaneously awaken with normal respirations throughout without any intervention for such so feel this is less likely.  Additionally, opiate medications are not on the patient's medication list.  Did also consider seizure given reported generalized soreness in the body but no post-ictal period and no prior history per patient, records, or daughter; feel this is less likely.  Glucose within normal limits with EMS, will obtain CMP to evaluate for other possible electrolyte derangements.  We will also obtain ammonia though no reported liver disease so feel  hyperammonemia less likely.  Will obtain head CT to evaluate for possible acute intracranial abnormality including bleed/thrombotic stroke though patient without focal neurologic deficits on exam.  The patient's daughter arrived shortly after my initial examination, and was able to provide further history.  She states that these episodes have happened several times in the past including once she has witnessed, though it has been a few months since the last 1.  She states that he has been evaluated for this several times in the emergency department with reassuring work-up each time.  She states that with these episodes he will all of a sudden fall asleep, have normal vital signs, seem to be in a very deep sleep, and then spontaneously awaken.  He has no prolonged period of confusion or altered mental status after awakening.  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  Patient does have a history of Lewy body dementia, which is associated with more sleep disturbances and I wonder if this could be contributing.    Per June 2023 ED visit for the same, neurology was consulted and stated that they "would consider outpatient EEG."  Per records, this does not appear to have been obtained yet.  Work-up: Work-up today overall unremarkable.  No leukocytosis to suggest new infection, WBC 5.2.  Hemoglobin 11.5.  CMP with mild changes including potassium 3.4, chloride 114, CO2 18, low protein at 5.1 and low albumin at 3.0, hypocalcemia to 7.6.  Creatinine within normal limits at 0.91, LFTs within normal limits.  Ammonia within normal limits at 19.  T4 within normal limits. UA with negative nitrites, negative leukocytes, no evidence of UTI.  UDS negative. Corrected Ca 8.4.   CXR with signs of atelectasis, no evidence of pneumonia. CT head on my independent review as well as review by radiology demonstrates no acute change from prior, stable atrophy and chronic small vessel ischemia.   I reassessed the  patient, he states that he feels fairly at his baseline and well at this time.  He has not had any repeat episodes in the emergency department and continues to have a nonfocal exam.  As patient has completely returned to baseline without recurrent episodes in the emergency department and has reassuring work-up today, I feel that discharge back to facility is reasonable  at this time.  I spoke with the patient's daughter over the phone again, and she confirms that she feels safe with plan for discharge with outpatient neurology follow-up.  We discussed the importance of mentioning outpatient EEG to his current neurologist, and she was understanding of and agree with this plan.  This was additionally included in his discharge instructions, as well as instructions for repeat hemoglobin and calcium in a few days.  Patient discharged in stable condition back to facility.        Final Clinical Impression(s) / ED Diagnoses Final diagnoses:  None    Rx / DC Orders ED Discharge Orders     None         Renard Matter, MD 08/20/22 [place], MD 08/21/22 1230

## 2022-11-30 IMAGING — CT CT CERVICAL SPINE W/O CM
3 of 4 series · 10 of 33 positions shown, 12 images · non-contrast
Comparison: Head CT today.  CTA head and neck 03/23/2022.

CLINICAL DATA: 81-year-old male status post fall from bed. Scalp
lacerations.



[Series 4: sagittal bone · sagittal · 0.27mm/px · 5 of 61 slices shown, 6 images]
[im 21/61  bone]
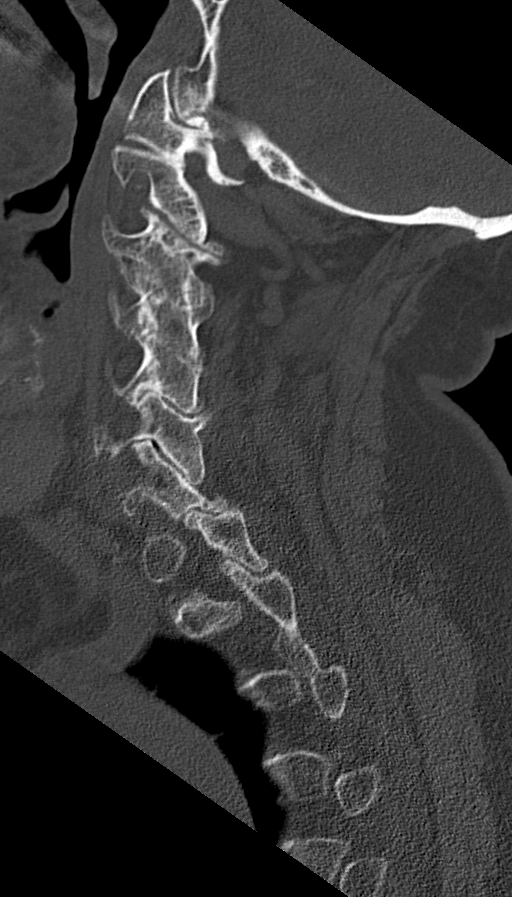
[im 26/61  bone]
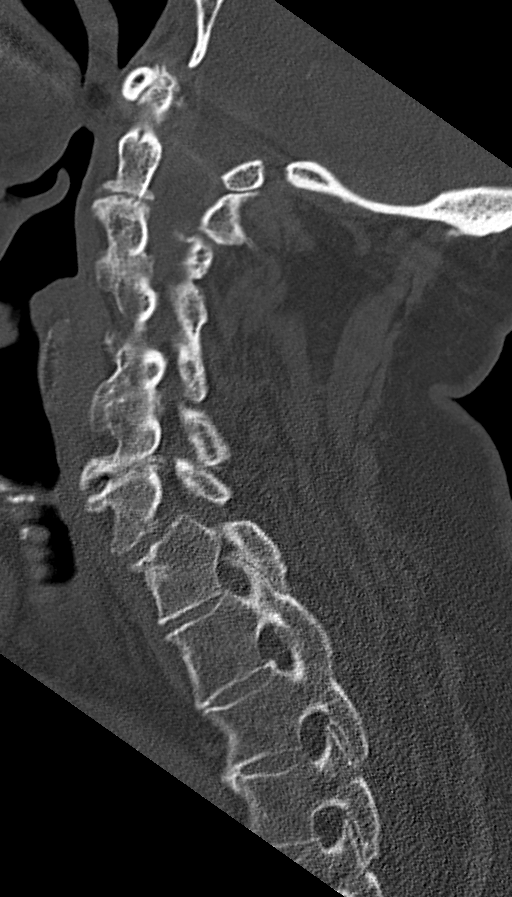
[im 31/61  soft-tissue]
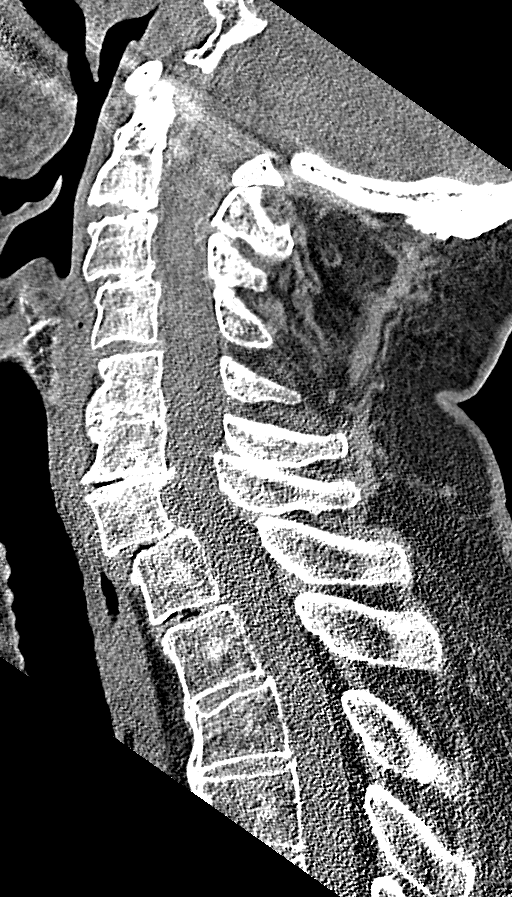
[im 31/61  bone]
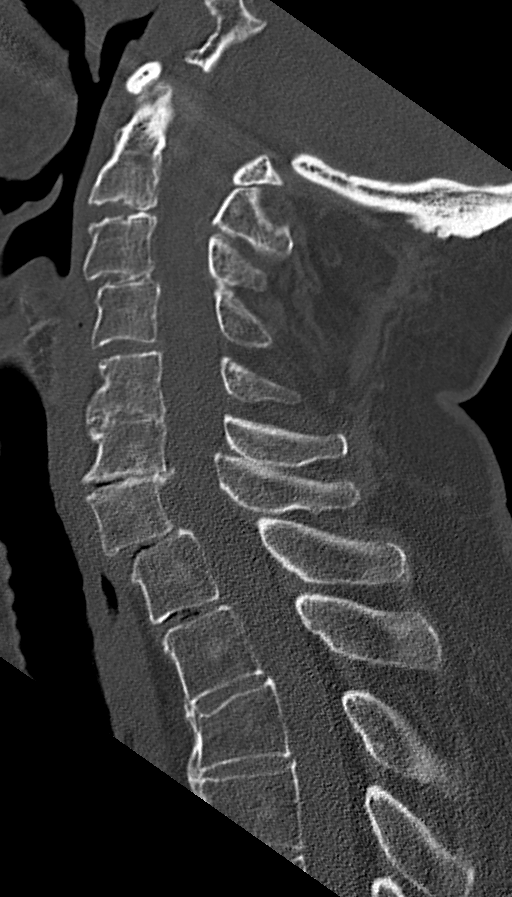
[im 36/61  bone]
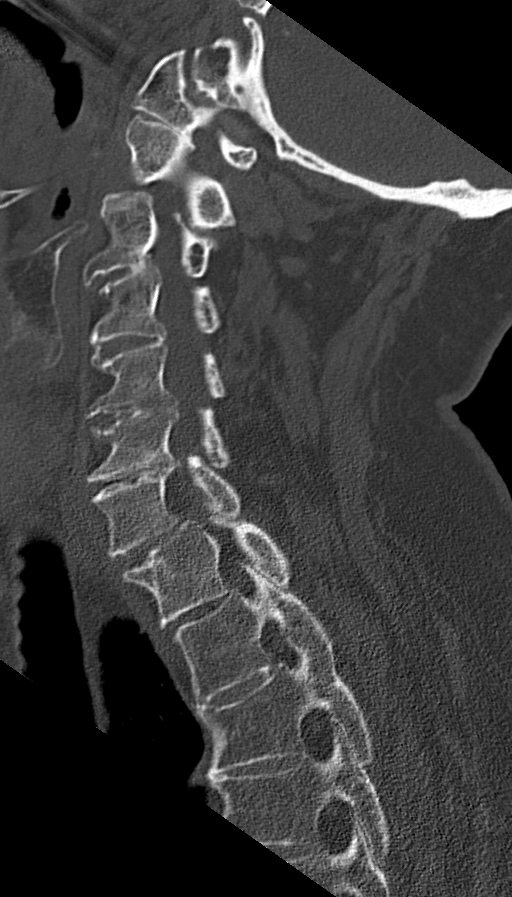
[im 41/61  bone]
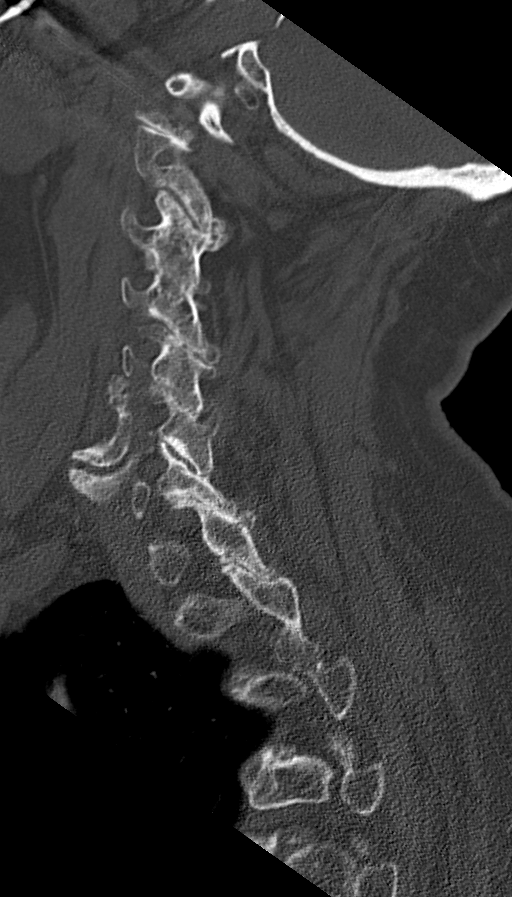

[Series 7: orthogonal bone · axial · 0.23mm/px · z∈[-268,-192]mm · 2 of 108 slices shown, 3 images]
[im 31/108  soft-tissue]
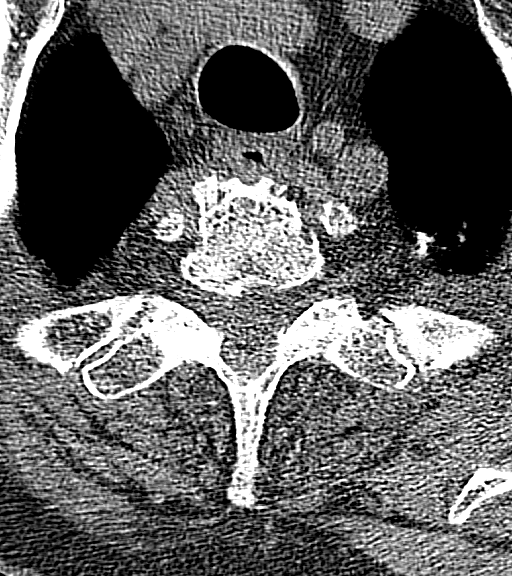
[im 31/108  bone]
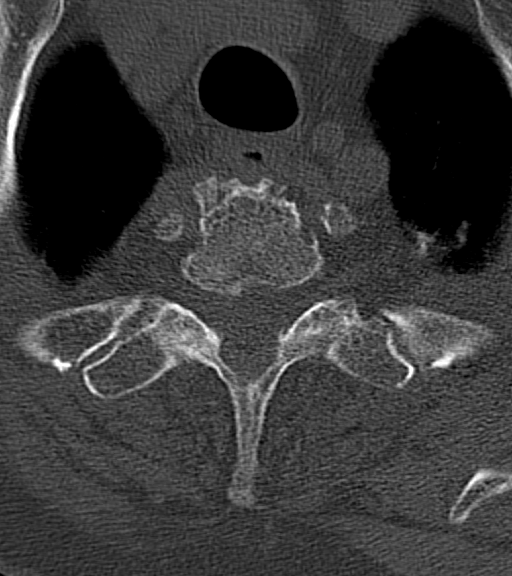
[im 77/108  bone]
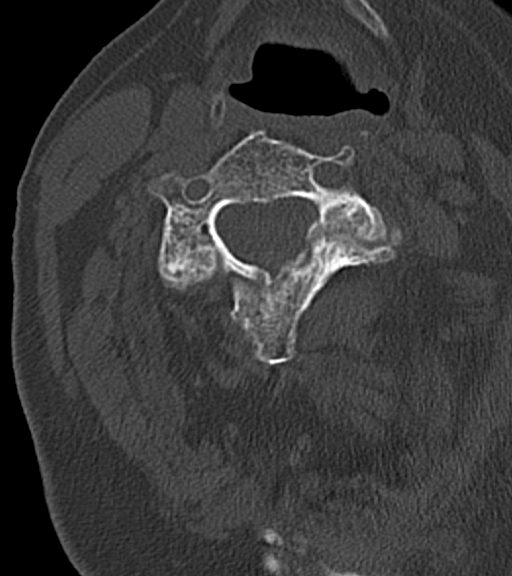

[Series 8: coronal bone · coronal · 0.27mm/px · 3 of 62 slices shown]
[im 17/62  bone]
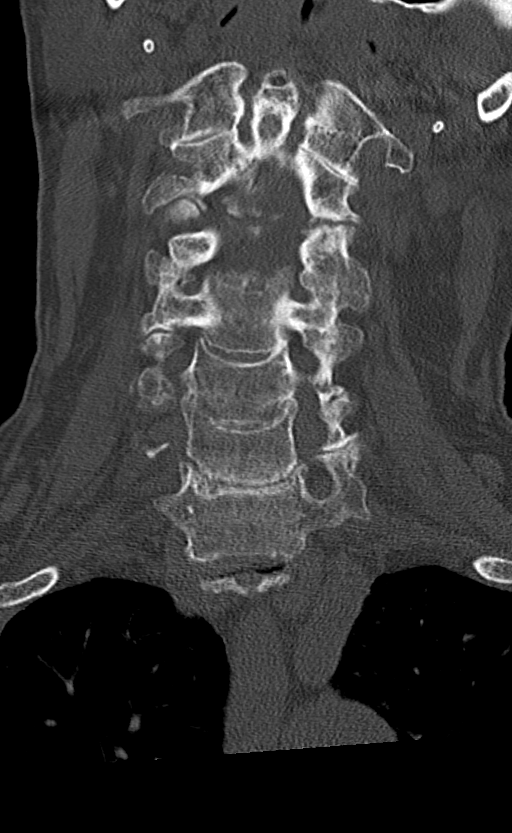
[im 26/62  bone]
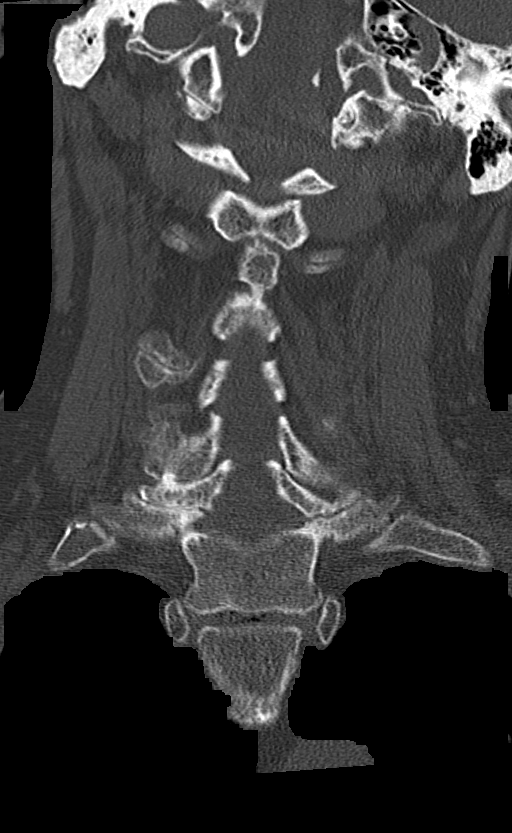
[im 36/62  bone]
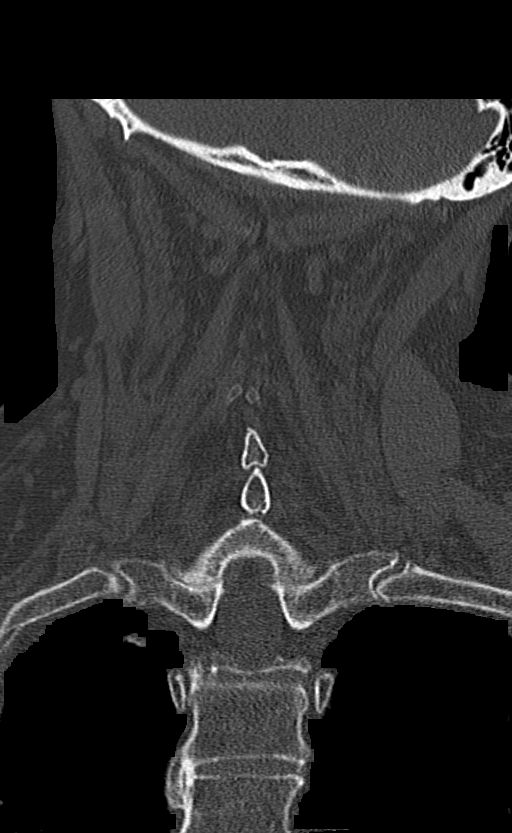

[10 of 33 positions shown; findings below may reference images not displayed]

FINDINGS: Alignment: Stable. Straightening of cervical lordosis superimposed
on multilevel spondylolisthesis, including fairly pronounced 4-5 mm
of anterolisthesis at the cervicothoracic junction. But underlying
multilevel cervical spine ankylosis. Stable posterior element
alignment.

Skull base and vertebrae: Visualized skull base is intact. No
atlanto-occipital dissociation. C1 and C2 appear intact and aligned.

C3 through C6 interbody and/or posterior element ankylosis. No acute
osseous abnormality identified.

Soft tissues and spinal canal: No prevertebral fluid or swelling. No
visible canal hematoma. Negative noncontrast neck soft tissues
except for left carotid calcified atherosclerosis.

Disc levels: Advanced cervical spine degeneration in the setting of
chronic C3 through C6 ankylosis. Severe C6-C7 disc and endplate
degeneration. Pronounced degenerative appearing spondylolisthesis at
C7-T1. and facet arthropathy at that level as well as C2-C3. Mild
degenerative spinal stenosis suspected at C6-C7.

Upper chest: Mild chronic T3 superior endplate compression fracture
and there is multilevel upper thoracic spine ankylosis beginning at
T2. Chronic posterior right 3rd through 5th rib fractures.
IMPRESSION: 1. No acute traumatic injury identified in the cervical spine.
2. Cervical spine ankylosis C3 through C6 with advanced degeneration
of other levels, including pronounced spondylolisthesis at C7-T1.
Suspected spinal stenosis at C6-C7.
3. Chronic right upper rib fractures and mild T3 compression.

## 2022-11-30 IMAGING — CT CT HEAD W/O CM
3 series · 15 of 47 positions shown, 18 images · non-contrast
Comparison: Recent brain MRI 03/23/2022 and earlier.

CLINICAL DATA: 81-year-old male status post fall from bed. Scalp
lacerations.



[Series 3: head wo · axial · 0.49mm/px · z∈[-127,+18]mm · 9 of 35 slices shown, 12 images]
[im 3/35  brain]
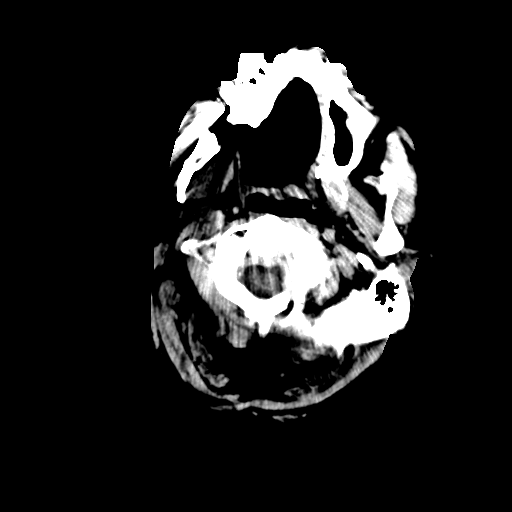
[im 3/35  bone]
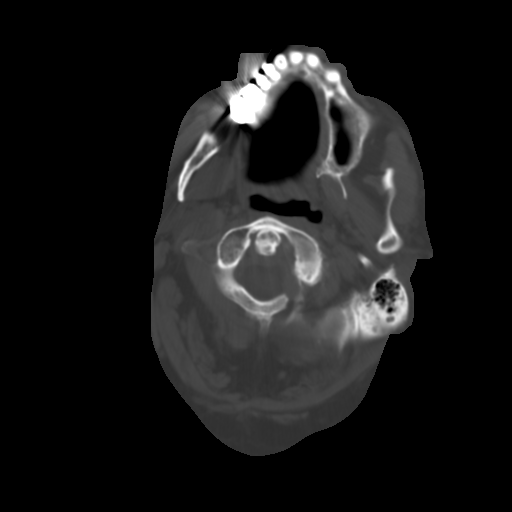
[im 6/35  brain]
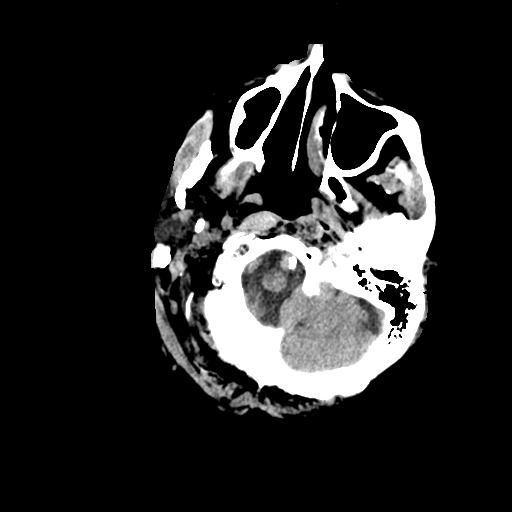
[im 10/35  brain]
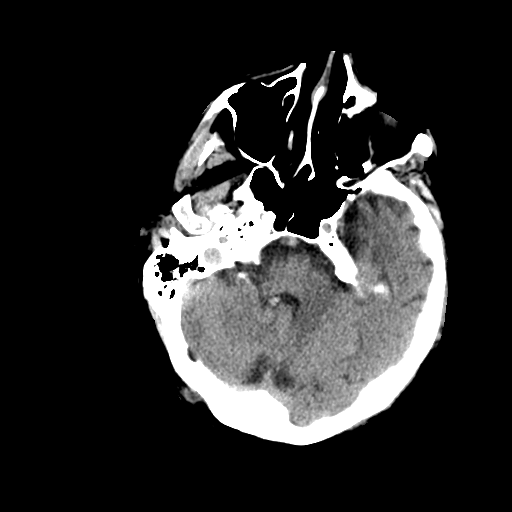
[im 13/35  brain]
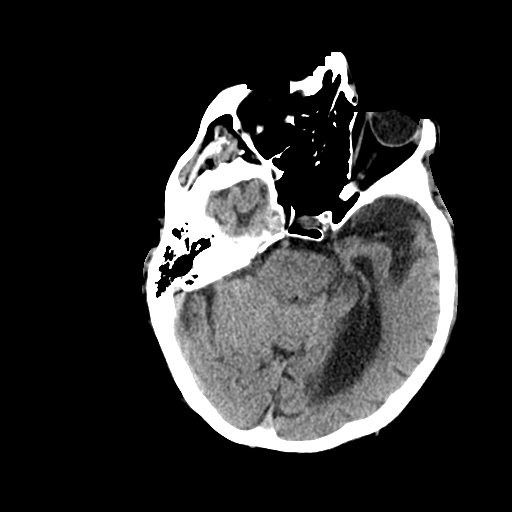
[im 18/35  brain]
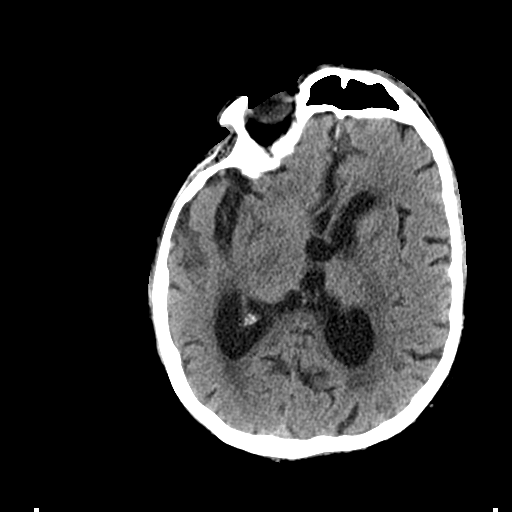
[im 18/35  bone]
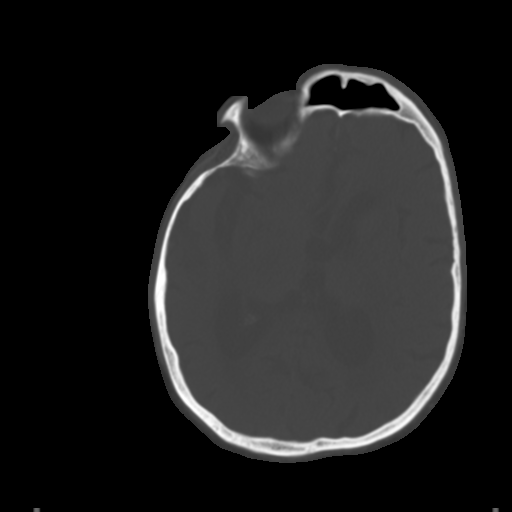
[im 22/35  brain]
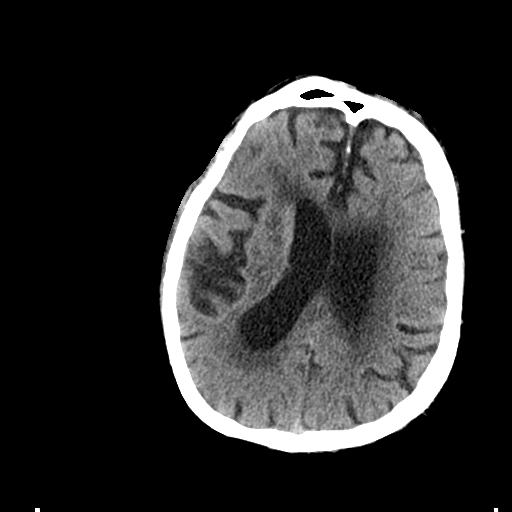
[im 25/35  brain]
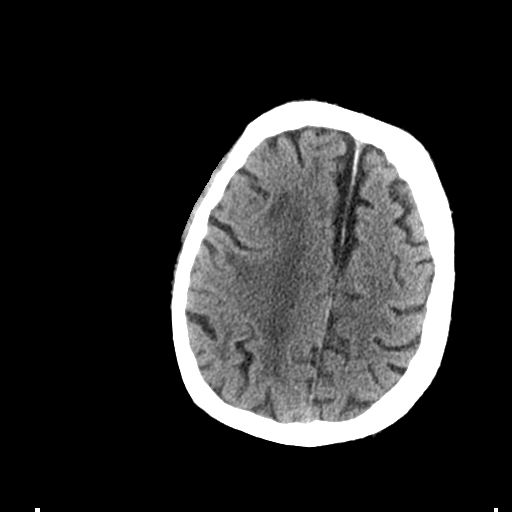
[im 29/35  brain]
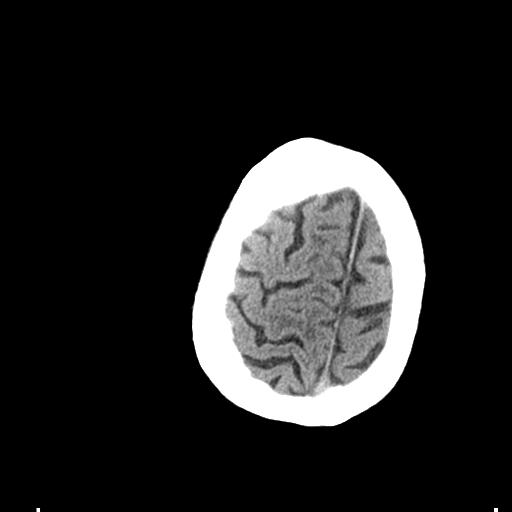
[im 32/35  brain]
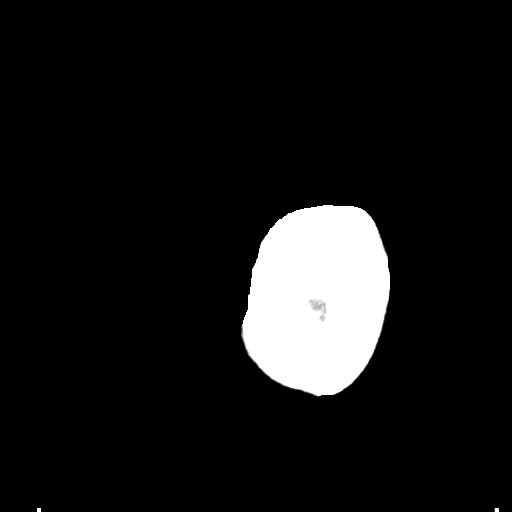
[im 32/35  bone]
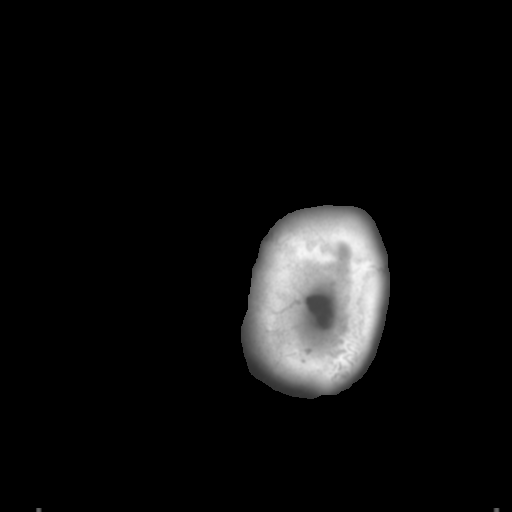

[Series 5: coronal soft tissue · coronal · 0.33mm/px · 3 of 73 slices shown]
[im 25/73  brain]
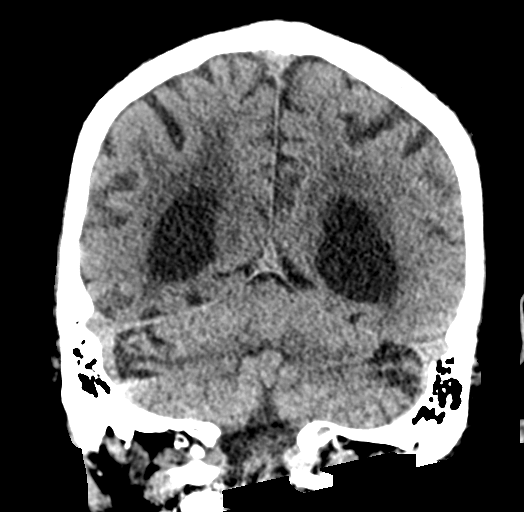
[im 33/73  brain]
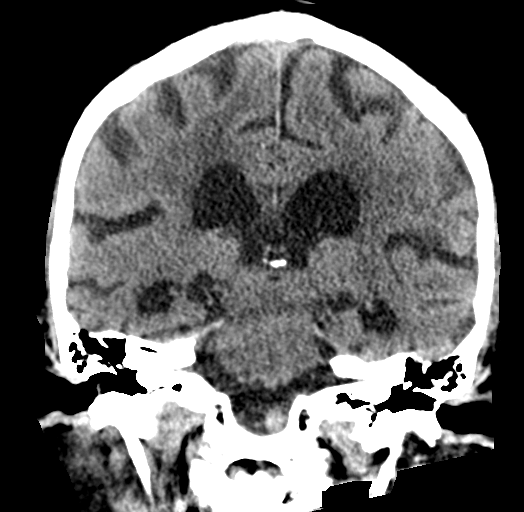
[im 41/73  brain]
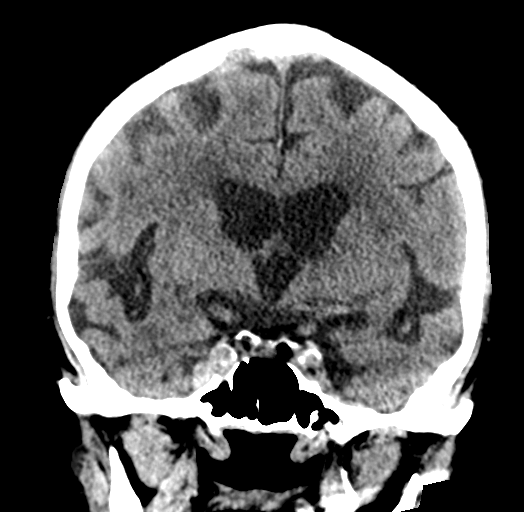

[Series 6: sagittal soft tissue · sagittal · 0.33mm/px · 3 of 55 slices shown]
[im 24/55  brain]
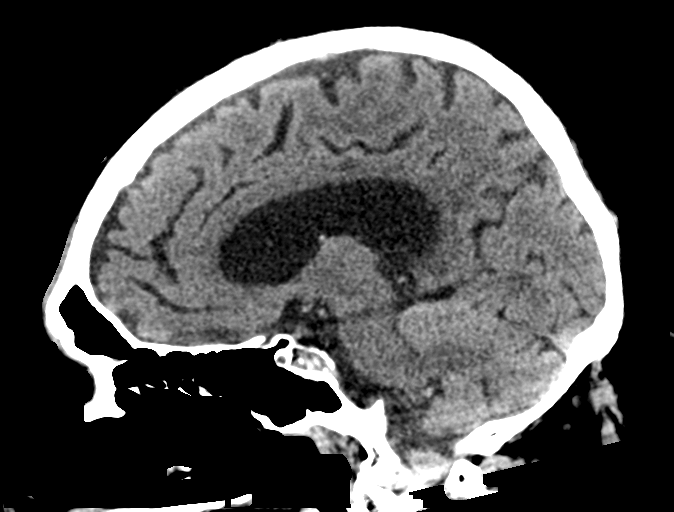
[im 28/55  brain]
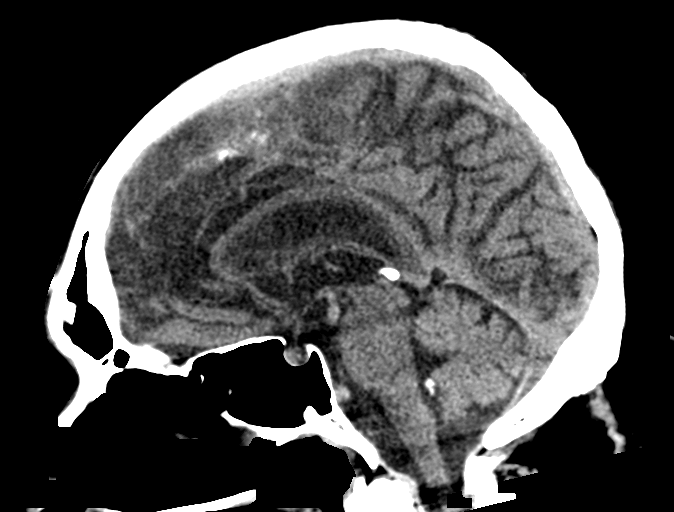
[im 31/55  brain]
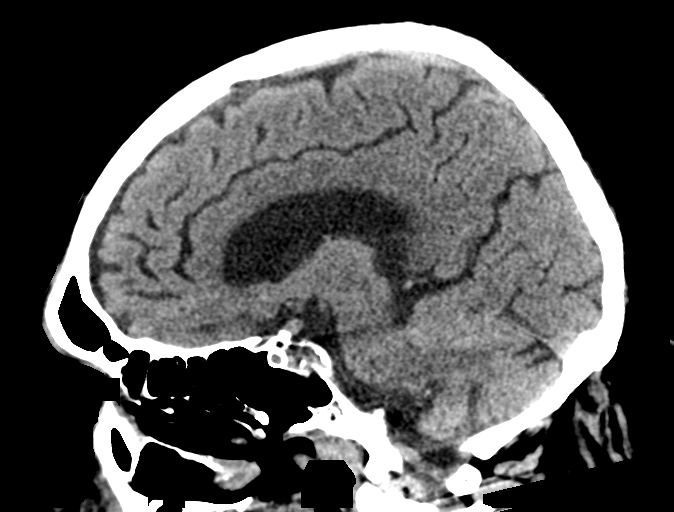

[15 of 47 positions shown; findings below may reference images not displayed]

FINDINGS: Brain: No midline shift, ventriculomegaly, mass effect, evidence of
mass lesion, intracranial hemorrhage or evidence of cortically based
acute infarction.

Disproportionate mesial temporal lobe volume loss (series 5, image
36) redemonstrated. Stable gray-white matter differentiation
throughout the brain. Patchy bilateral white matter hypodensity.

Vascular: Calcified atherosclerosis at the skull base. No suspicious
intracranial vascular hyperdensity.

Skull: Stable.  No acute osseous abnormality identified.

Sinuses/Orbits: Visualized paranasal sinuses and mastoids are clear.

Other: Left forehead and anterior frontal convexity scalp laceration
and hematoma (series 4 images 46 through 57) with a small volume of
tracking soft tissue gas. Underlying left frontal bone appears
intact. Orbits soft tissues appears stable and negative.
IMPRESSION: 1. Left forehead and scalp soft tissue injury without underlying
skull fracture.
2. No acute intracranial abnormality. Stable non contrast CT
appearance of the brain from earlier this month.
